# Patient Record
Sex: Male | Born: 1935 | Race: White | Hispanic: No | Marital: Married | Smoking: Never smoker
Health system: Southern US, Community
[De-identification: ages and names within clinical notes are randomized; demographics above are authoritative.]

## PROBLEM LIST (undated history)

## (undated) DIAGNOSIS — I10 Essential (primary) hypertension: Secondary | ICD-10-CM

## (undated) DIAGNOSIS — C801 Malignant (primary) neoplasm, unspecified: Secondary | ICD-10-CM

## (undated) DIAGNOSIS — IMO0002 Reserved for concepts with insufficient information to code with codable children: Secondary | ICD-10-CM

## (undated) DIAGNOSIS — C8510 Unspecified B-cell lymphoma, unspecified site: Secondary | ICD-10-CM

## (undated) DIAGNOSIS — C679 Malignant neoplasm of bladder, unspecified: Secondary | ICD-10-CM

## (undated) HISTORY — DX: Reserved for concepts with insufficient information to code with codable children: IMO0002

## (undated) HISTORY — DX: Malignant (primary) neoplasm, unspecified: C80.1

## (undated) HISTORY — DX: Malignant neoplasm of bladder, unspecified: C67.9

## (undated) HISTORY — DX: Essential (primary) hypertension: I10

## (undated) HISTORY — PX: CYSTOSCOPY: SUR368

## (undated) HISTORY — DX: Unspecified B-cell lymphoma, unspecified site: C85.10

---

## 2002-10-10 ENCOUNTER — Ambulatory Visit (HOSPITAL_COMMUNITY): Admission: RE | Admit: 2002-10-10 | Discharge: 2002-10-10 | Payer: Self-pay | Admitting: Specialist

## 2002-10-10 ENCOUNTER — Encounter: Payer: Self-pay | Admitting: Specialist

## 2004-07-22 ENCOUNTER — Inpatient Hospital Stay (HOSPITAL_COMMUNITY): Admission: AD | Admit: 2004-07-22 | Discharge: 2004-07-26 | Payer: Self-pay | Admitting: Neurosurgery

## 2004-07-25 ENCOUNTER — Encounter (INDEPENDENT_AMBULATORY_CARE_PROVIDER_SITE_OTHER): Payer: Self-pay | Admitting: *Deleted

## 2004-08-04 ENCOUNTER — Encounter (INDEPENDENT_AMBULATORY_CARE_PROVIDER_SITE_OTHER): Payer: Self-pay | Admitting: Specialist

## 2004-08-04 ENCOUNTER — Ambulatory Visit (HOSPITAL_COMMUNITY): Admission: RE | Admit: 2004-08-04 | Discharge: 2004-08-04 | Payer: Self-pay | Admitting: Oncology

## 2004-08-23 ENCOUNTER — Encounter (INDEPENDENT_AMBULATORY_CARE_PROVIDER_SITE_OTHER): Payer: Self-pay | Admitting: Specialist

## 2004-08-23 ENCOUNTER — Inpatient Hospital Stay (HOSPITAL_COMMUNITY): Admission: RE | Admit: 2004-08-23 | Discharge: 2004-08-25 | Payer: Self-pay | Admitting: Neurosurgery

## 2004-09-06 ENCOUNTER — Inpatient Hospital Stay (HOSPITAL_COMMUNITY): Admission: EM | Admit: 2004-09-06 | Discharge: 2004-09-07 | Payer: Self-pay | Admitting: *Deleted

## 2004-09-15 ENCOUNTER — Encounter: Admission: RE | Admit: 2004-09-15 | Discharge: 2004-09-15 | Payer: Self-pay | Admitting: Oncology

## 2004-10-07 ENCOUNTER — Ambulatory Visit (HOSPITAL_COMMUNITY): Admission: RE | Admit: 2004-10-07 | Discharge: 2004-10-07 | Payer: Self-pay | Admitting: Oncology

## 2004-10-11 ENCOUNTER — Ambulatory Visit: Payer: Self-pay | Admitting: Oncology

## 2004-11-17 ENCOUNTER — Ambulatory Visit (HOSPITAL_COMMUNITY): Admission: RE | Admit: 2004-11-17 | Discharge: 2004-11-17 | Payer: Self-pay | Admitting: Oncology

## 2005-02-02 ENCOUNTER — Ambulatory Visit: Payer: Self-pay | Admitting: Oncology

## 2005-04-03 ENCOUNTER — Ambulatory Visit: Payer: Self-pay | Admitting: Oncology

## 2005-04-06 ENCOUNTER — Ambulatory Visit (HOSPITAL_COMMUNITY): Admission: RE | Admit: 2005-04-06 | Discharge: 2005-04-06 | Payer: Self-pay | Admitting: Oncology

## 2005-06-16 ENCOUNTER — Ambulatory Visit: Payer: Self-pay | Admitting: Oncology

## 2005-08-03 ENCOUNTER — Ambulatory Visit (HOSPITAL_COMMUNITY): Admission: RE | Admit: 2005-08-03 | Discharge: 2005-08-03 | Payer: Self-pay | Admitting: Orthopedic Surgery

## 2005-08-03 ENCOUNTER — Ambulatory Visit (HOSPITAL_BASED_OUTPATIENT_CLINIC_OR_DEPARTMENT_OTHER): Admission: RE | Admit: 2005-08-03 | Discharge: 2005-08-03 | Payer: Self-pay | Admitting: Orthopedic Surgery

## 2005-08-11 ENCOUNTER — Ambulatory Visit: Payer: Self-pay | Admitting: Oncology

## 2005-10-13 ENCOUNTER — Ambulatory Visit: Payer: Self-pay | Admitting: Oncology

## 2005-12-12 ENCOUNTER — Ambulatory Visit: Payer: Self-pay | Admitting: Oncology

## 2006-02-12 ENCOUNTER — Ambulatory Visit: Payer: Self-pay | Admitting: Oncology

## 2006-04-11 ENCOUNTER — Ambulatory Visit: Payer: Self-pay | Admitting: Oncology

## 2006-04-13 LAB — CBC WITH DIFFERENTIAL/PLATELET
Basophils Absolute: 0 10*3/uL (ref 0.0–0.1)
EOS%: 1.6 % (ref 0.0–7.0)
HCT: 41.2 % (ref 38.7–49.9)
HGB: 14.3 g/dL (ref 13.0–17.1)
LYMPH%: 36.9 % (ref 14.0–48.0)
MCH: 31.5 pg (ref 28.0–33.4)
MCV: 91 fL (ref 81.6–98.0)
NEUT%: 52.2 % (ref 40.0–75.0)
Platelets: 246 10*3/uL (ref 145–400)
lymph#: 2.6 10*3/uL (ref 0.9–3.3)

## 2006-04-13 LAB — COMPREHENSIVE METABOLIC PANEL
ALT: 27 U/L (ref 0–40)
CO2: 28 mEq/L (ref 19–32)
Calcium: 9.1 mg/dL (ref 8.4–10.5)
Chloride: 102 mEq/L (ref 96–112)
Creatinine, Ser: 0.9 mg/dL (ref 0.4–1.5)
Total Protein: 6.9 g/dL (ref 6.0–8.3)

## 2006-04-13 LAB — LACTATE DEHYDROGENASE: LDH: 147 U/L (ref 94–250)

## 2006-04-13 LAB — MORPHOLOGY: PLT EST: ADEQUATE

## 2006-04-20 LAB — HEPATIC FUNCTION PANEL
AST: 23 U/L (ref 0–37)
Bilirubin, Direct: 0.1 mg/dL (ref 0.0–0.3)
Total Bilirubin: 0.4 mg/dL (ref 0.3–1.2)

## 2006-06-28 IMAGING — XA IR FLUORO GUIDE NDL PLMT / BX
1 series · 4 of 4 positions shown · non-contrast
Comparison: none

CLINICAL DATA: Multiple osseous lesions in the spine and pelvis. No known primary. 
 CORE BIOPSY OF LEFT ILIAC BONE UNDER FLUOROSCOPY
TECHNIQUE: Based on the previous MRI from 07/23/2004 an appropriate approach was selected.  Overlying skin was prepped with Betadine and draped in the usual sterile fashion and infiltrated locally with 1% lidocaine.  Intravenous Fentanyl and Versed were administered as conscious sedation during continuous cardiorespiratory monitoring by radiology RN.  An 18 gauge Carrillo trocar needle was advanced across the cortex of the left iliac bone using a posterior approach at the level of the lesion identified on MR. Once the needle was secured in the cortex, multiple 13 gauge core biopsies were obtained using a coaxial bone biopsy needle.  The guiding needle was then removed.  No immediate complication.  
 IMPRESSION
 Technically successful core biopsy of left iliac bone lesion under fluoroscopic guidance.

[Series 1: run · 4 of 4 slices shown]
[im 1/4]
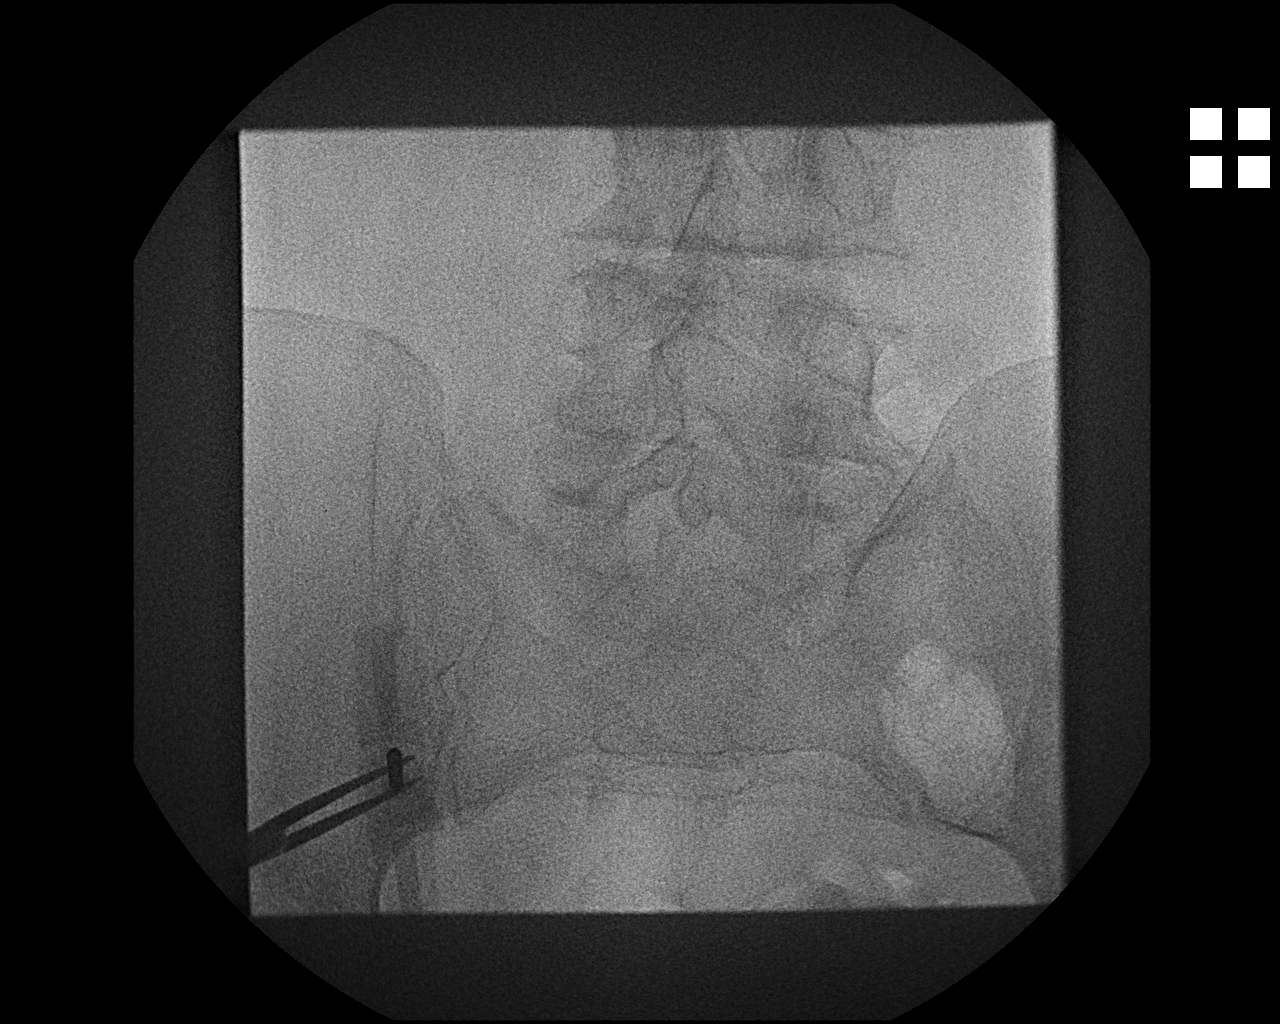
[im 2/4]
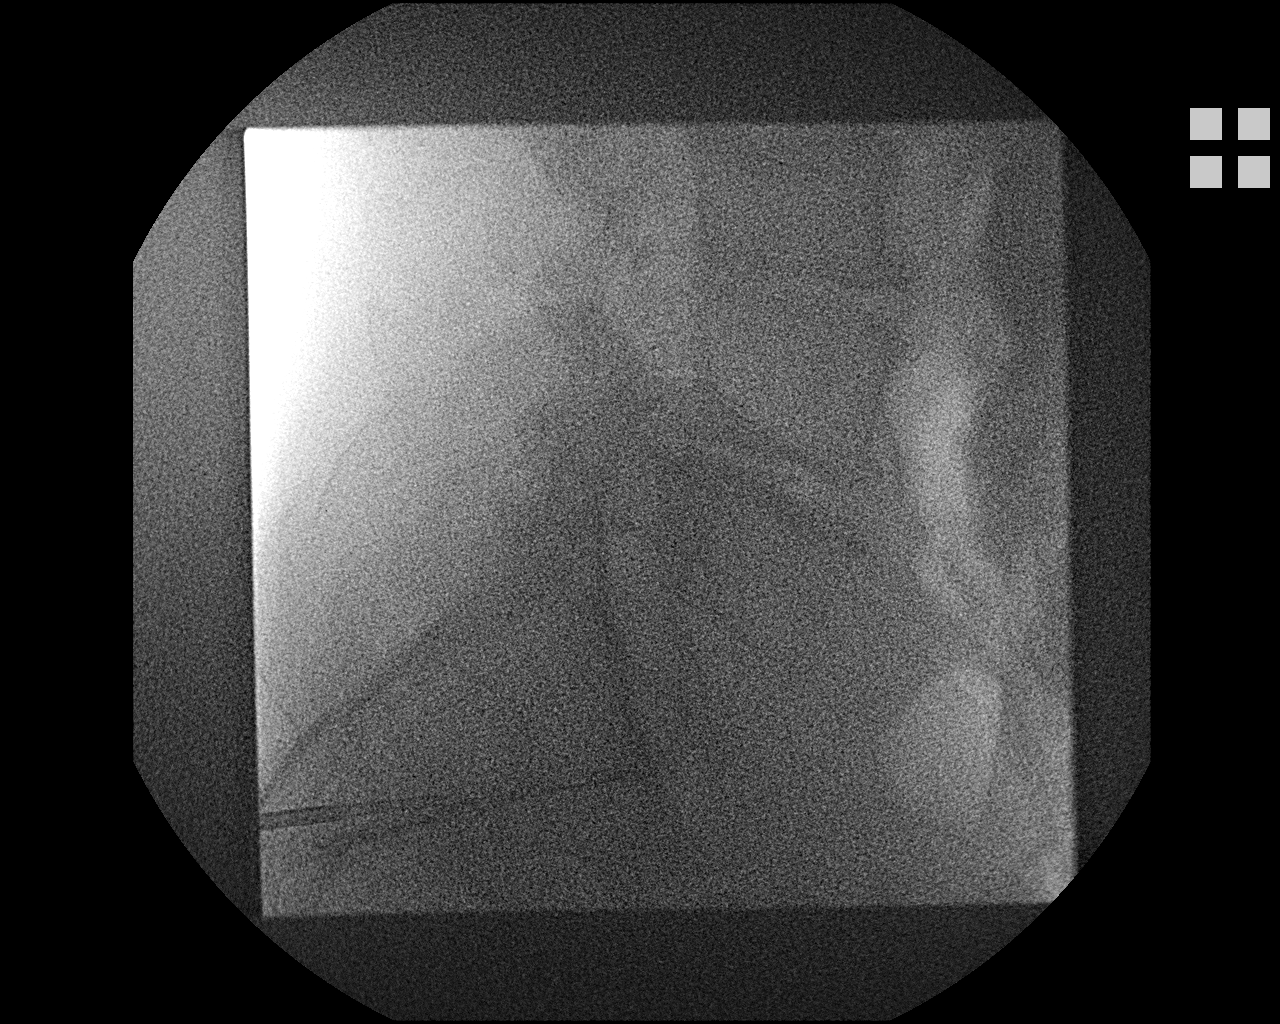
[im 3/4]
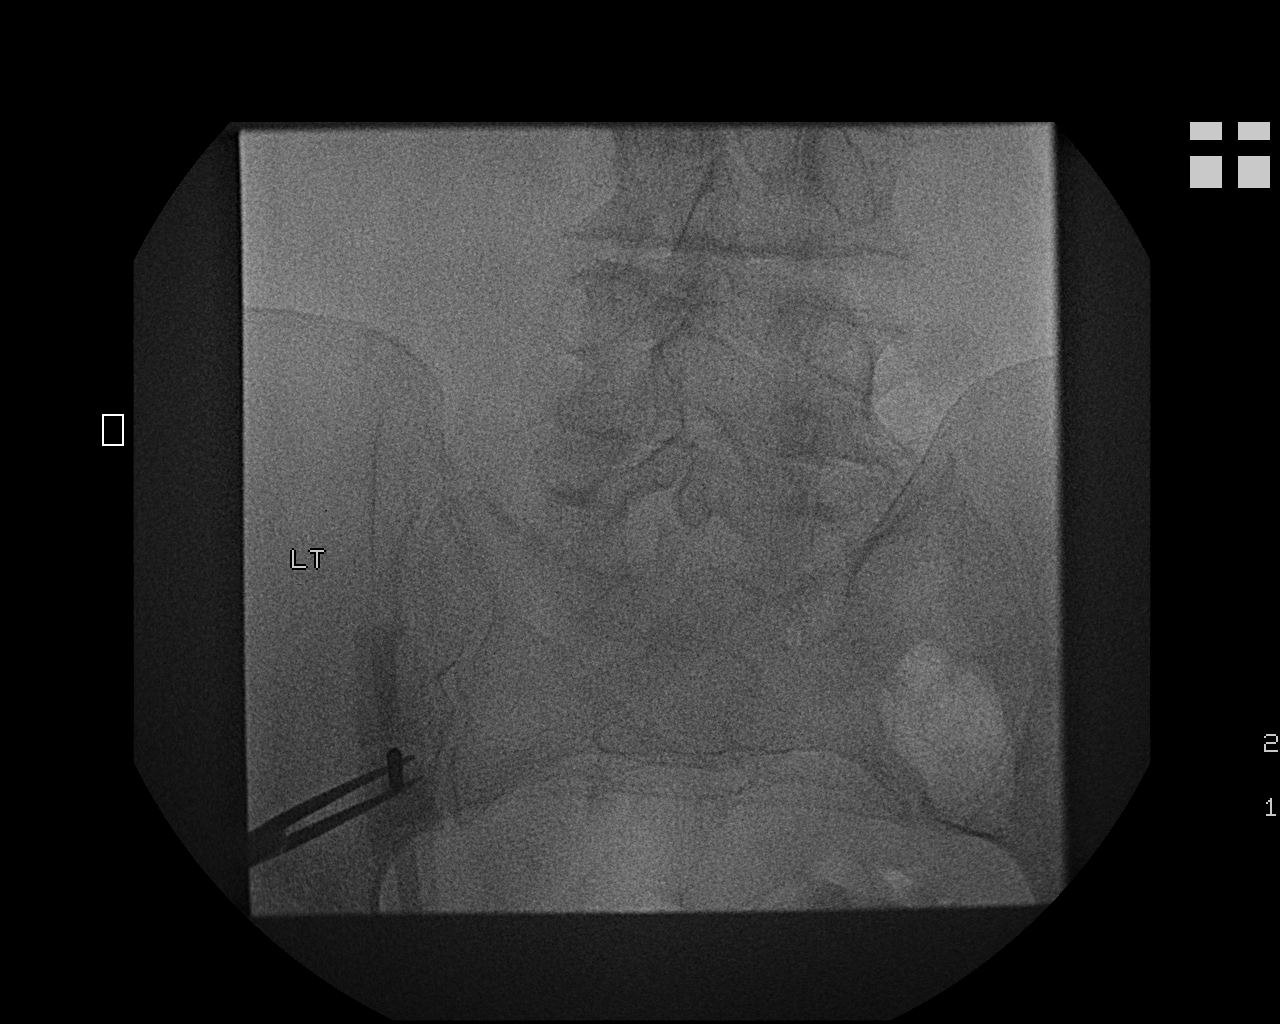
[im 4/4]
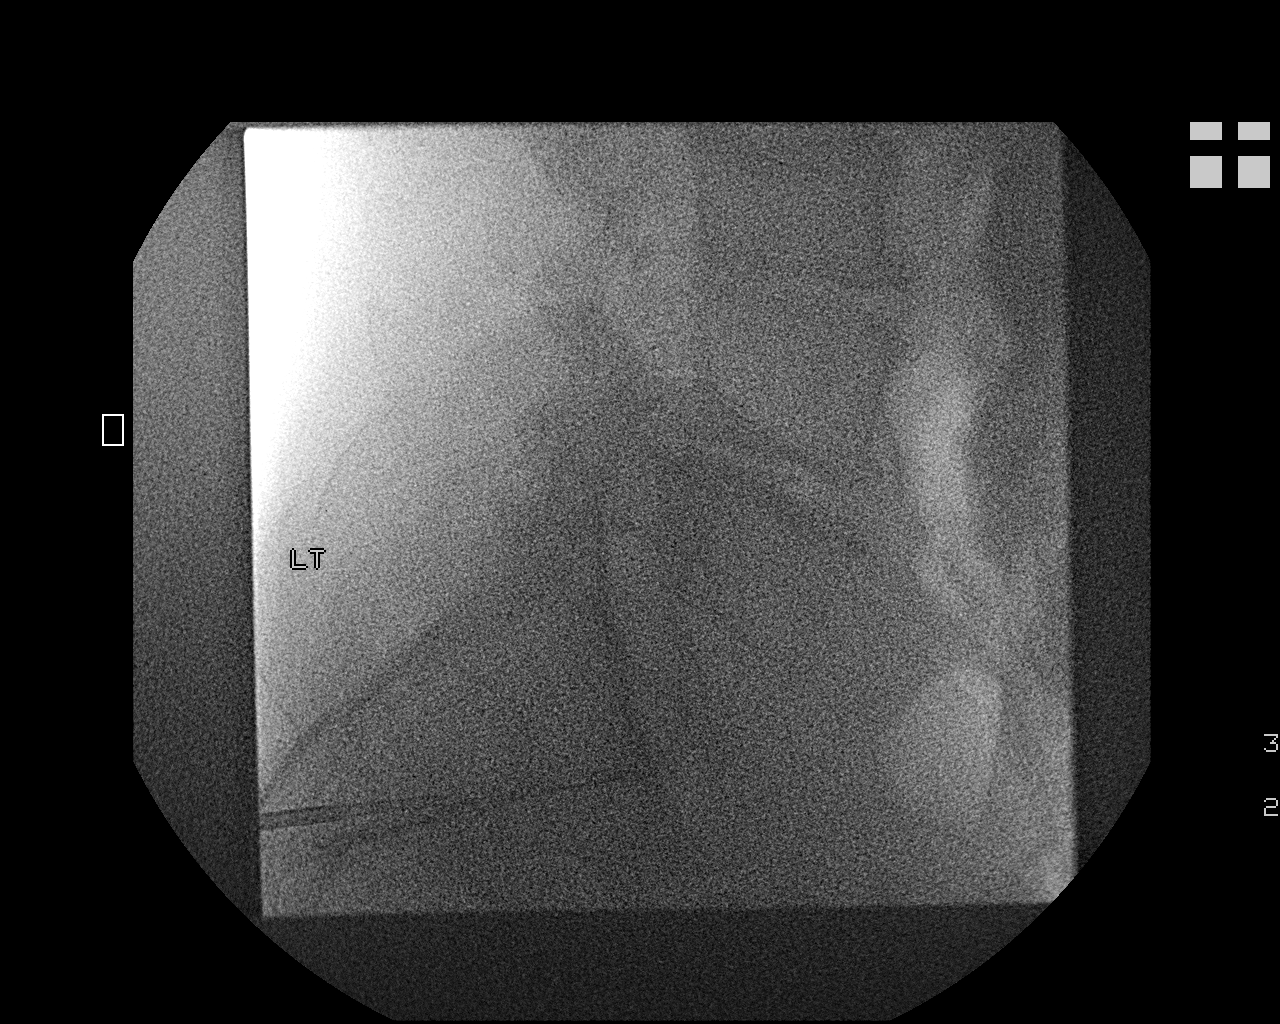

[4 of 4 positions shown; findings below may reference images not displayed]

## 2006-06-28 IMAGING — NM NM BONE WHOLE BODY
2 series · 2 of 2 positions shown · non-contrast
Comparison: Chest, abdomen and pelvis CT dated 07/23/04, cervical spine MR dated 07/23/04, lumbar spine MRI dated 07/22/04, cervical and thoracic spine MR dated 07/23/04.

CLINICAL DATA: Multiple spinal masses at recent spinal MR.  
WHOLE BODY NUCLEAR BONE SCAN ? 07/25/04

[Series 1: tb total body · 0.62mm/px · 1 of 1 slices shown (1 of 2)]
[im 1/1]
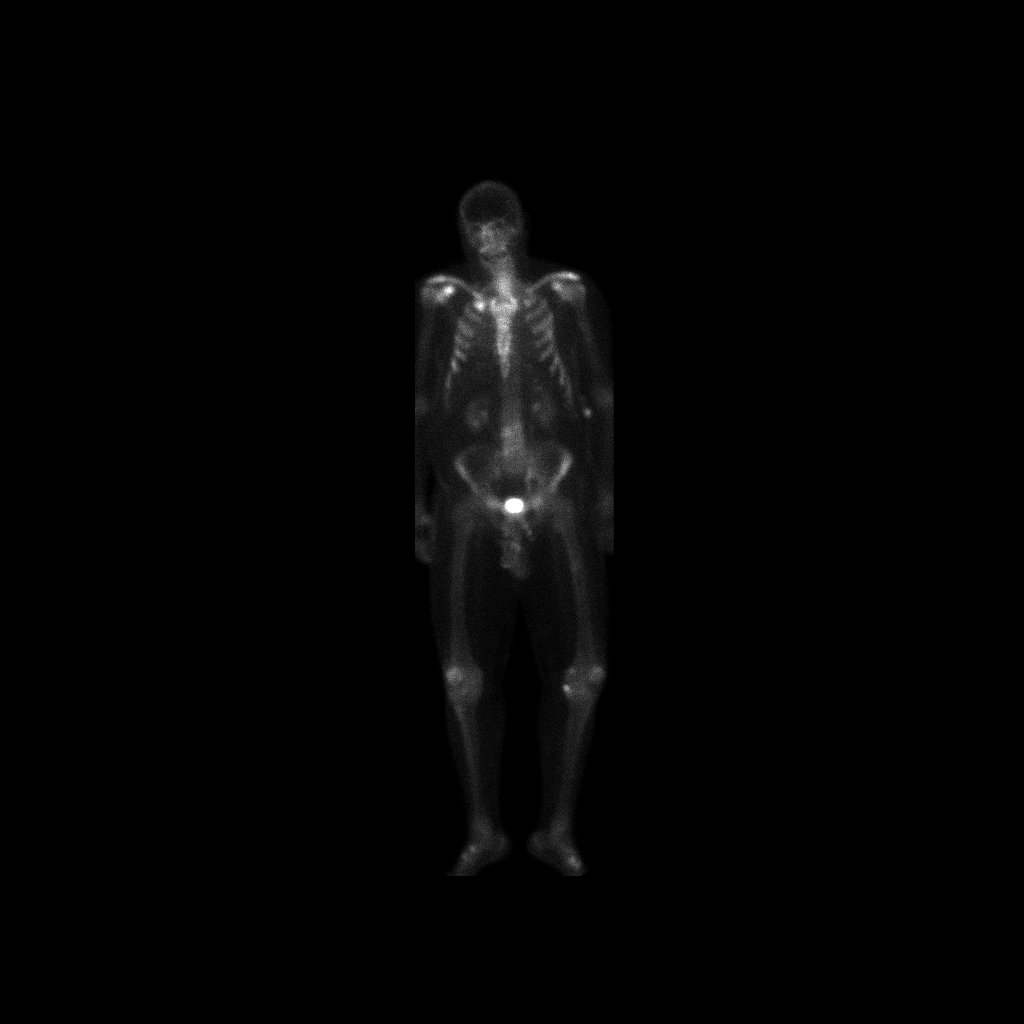

[Series 1: tb total body · 0.62mm/px · 1 of 1 slices shown (2 of 2)]
[im 1/1]
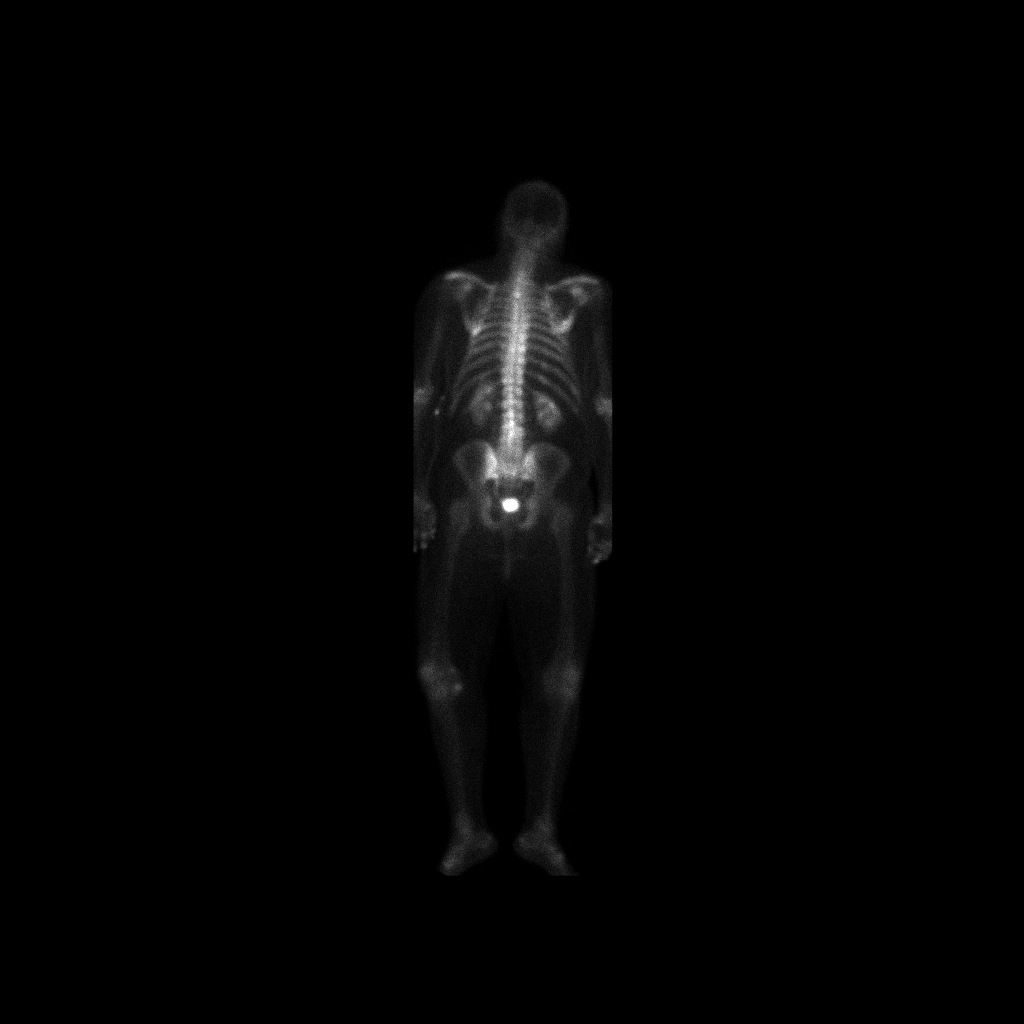

[2 of 2 positions shown; findings below may reference images not displayed]

Following the intravenous administration of 25 mCi of Technetium 99m MDP, delayed static images of the bony skeleton were obtained.   These demonstrate mildly increased tracer uptake in the medial aspects of the left knee, compatible with degenerative changes. Also demonstrated is increased tracer activity in the right mid foot and left mid to distal foot, compatible with degenerative changes.  The tracer distribution throughout the remainder of the bony skeleton is within normal limits, including the spine.   Normal renal and bladder activity.  
IMPRESSION 
Degenerative changes in the left knee and both feet, as described above. 
No abnormal tracer activity in the remainder of the bony skeleton, including the spine.  This makes multiple myeloma the most likely explanation for the multiple masses seen in the spine on the recent MR examinations.  Purely lytic metastatic disease is less likely.

## 2006-08-23 ENCOUNTER — Ambulatory Visit: Payer: Self-pay | Admitting: Oncology

## 2006-08-28 LAB — CBC WITH DIFFERENTIAL/PLATELET
BASO%: 0.5 % (ref 0.0–2.0)
EOS%: 1.1 % (ref 0.0–7.0)
MCH: 32.3 pg (ref 28.0–33.4)
MCHC: 34.9 g/dL (ref 32.0–35.9)
MONO#: 0.7 10*3/uL (ref 0.1–0.9)
NEUT%: 51.4 % (ref 40.0–75.0)
RBC: 4.43 10*6/uL (ref 4.20–5.71)
RDW: 13.6 % (ref 11.2–14.6)
WBC: 6.9 10*3/uL (ref 4.0–10.0)
lymph#: 2.5 10*3/uL (ref 0.9–3.3)

## 2006-08-28 LAB — COMPREHENSIVE METABOLIC PANEL
ALT: 30 U/L (ref 0–40)
Alkaline Phosphatase: 87 U/L (ref 39–117)
CO2: 25 mEq/L (ref 19–32)
Creatinine, Ser: 0.93 mg/dL (ref 0.40–1.50)
Sodium: 139 mEq/L (ref 135–145)
Total Bilirubin: 0.4 mg/dL (ref 0.3–1.2)

## 2006-08-28 LAB — MORPHOLOGY
PLT EST: ADEQUATE
RBC Comments: NORMAL

## 2006-09-10 IMAGING — CR DG CHEST 2V
2 series · 2 of 2 positions shown · non-contrast
Comparison: none

CLINICAL DATA: History of lymphoma.  Follow-up interstitial pneumonia. 
 PA AND LATERAL CHEST, 10/07/04
 Cardiac size is normal.  There is mild infiltrate within the left lung base but it is difficult to state whether there has been any change compared to the CT scan of 09/15/04.  Small pulmonary nodules on the CT are not readily apparent on these radiographs.  No pleural fluid.  Surgical hardware within the lower cervical spine.
 IMPRESSION
 Mild left basal infiltrate that could be scar or inflammatory.

[view not recorded (1 of 2)]
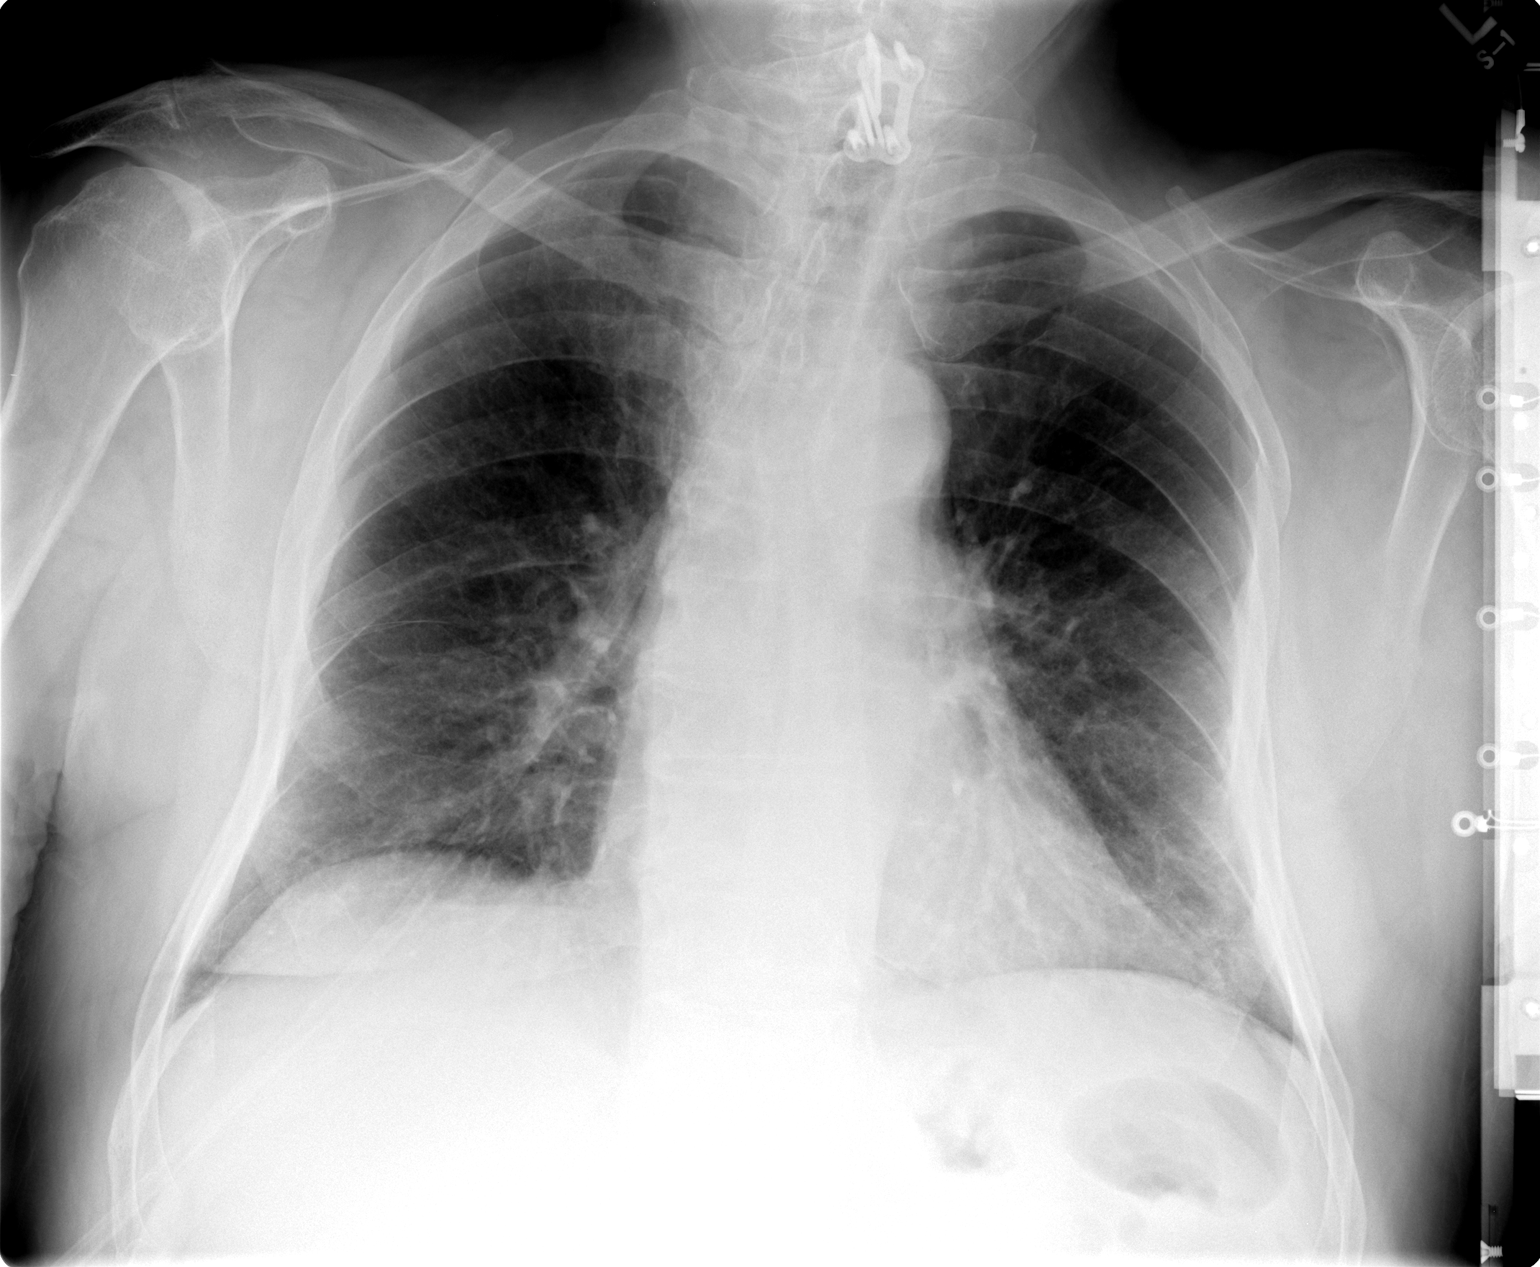

[view not recorded (2 of 2)]
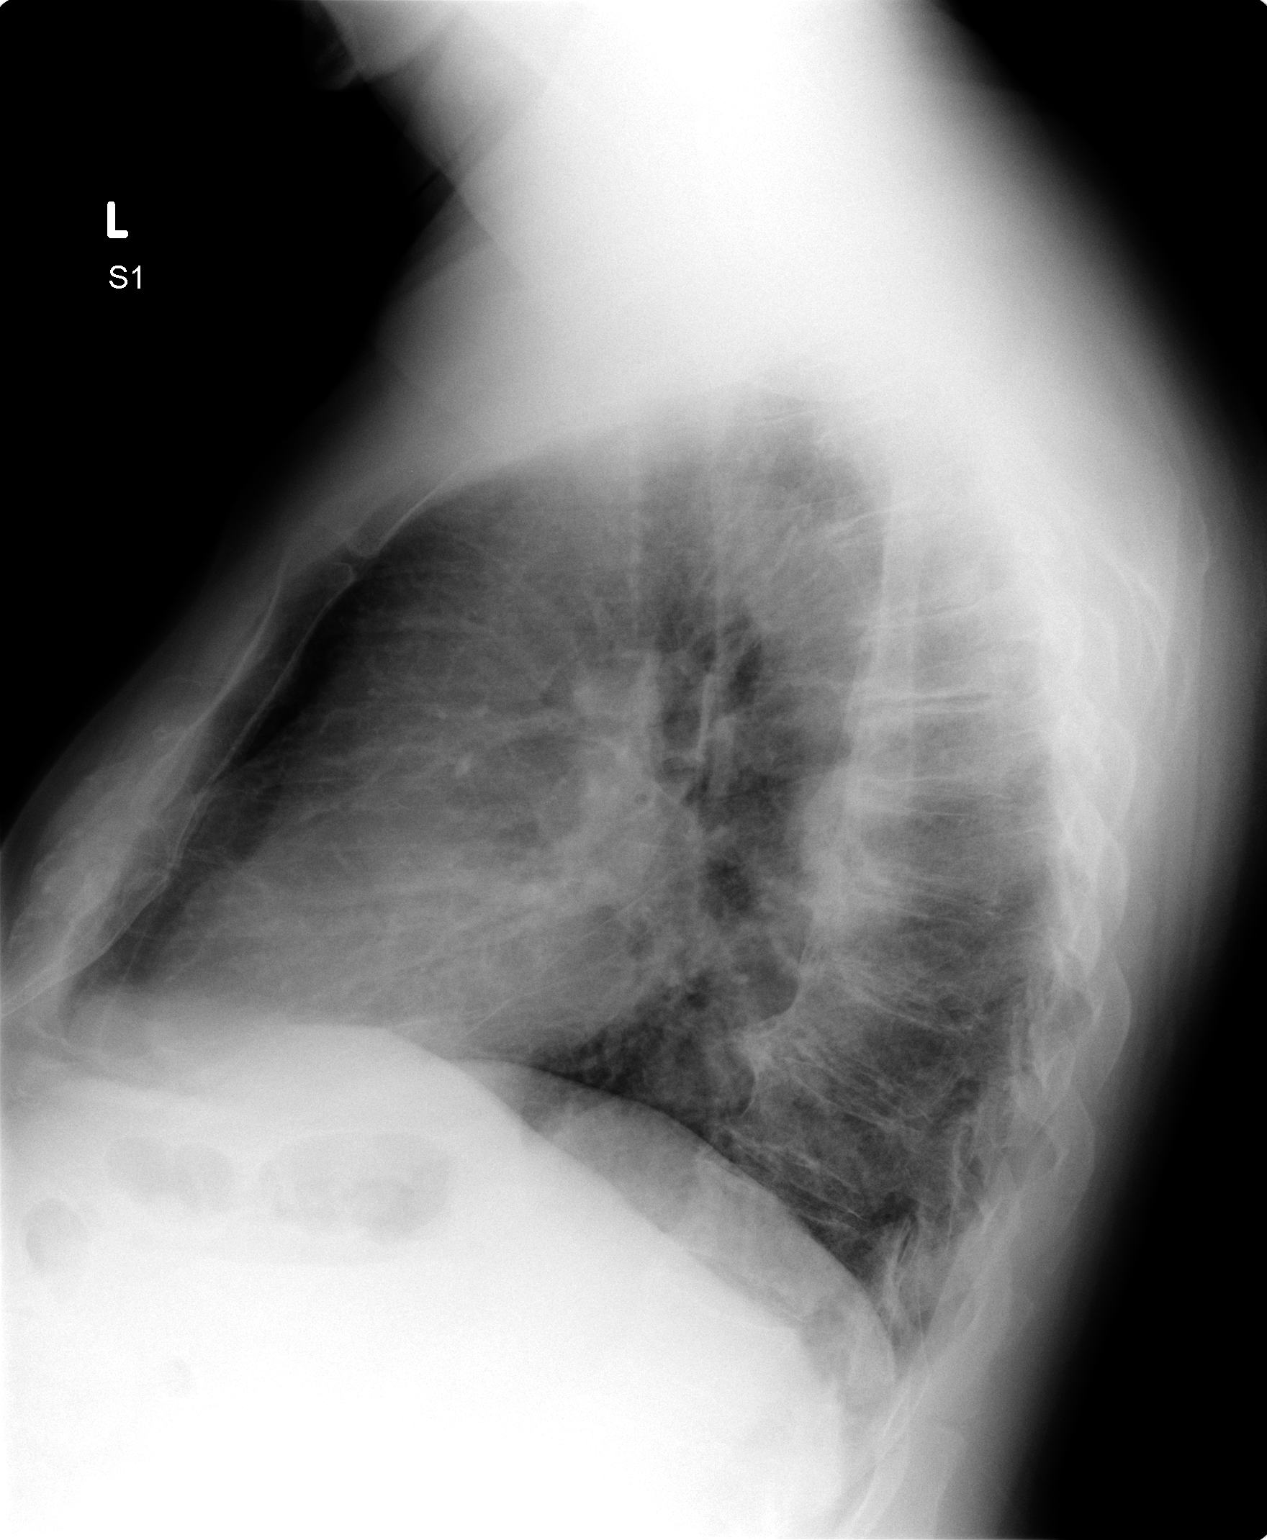

[2 of 2 positions shown; findings below may reference images not displayed]

## 2006-12-18 ENCOUNTER — Ambulatory Visit: Payer: Self-pay | Admitting: Oncology

## 2006-12-21 LAB — CBC WITH DIFFERENTIAL/PLATELET
Basophils Absolute: 0 10*3/uL (ref 0.0–0.1)
Eosinophils Absolute: 0.1 10*3/uL (ref 0.0–0.5)
HCT: 41 % (ref 38.7–49.9)
LYMPH%: 32.6 % (ref 14.0–48.0)
MONO#: 0.6 10*3/uL (ref 0.1–0.9)
NEUT#: 4.3 10*3/uL (ref 1.5–6.5)
NEUT%: 57.1 % (ref 40.0–75.0)
Platelets: 324 10*3/uL (ref 145–400)
WBC: 7.5 10*3/uL (ref 4.0–10.0)

## 2006-12-21 LAB — COMPREHENSIVE METABOLIC PANEL
Albumin: 4.3 g/dL (ref 3.5–5.2)
CO2: 26 mEq/L (ref 19–32)
Glucose, Bld: 109 mg/dL — ABNORMAL HIGH (ref 70–99)
Potassium: 4.3 mEq/L (ref 3.5–5.3)
Sodium: 140 mEq/L (ref 135–145)
Total Protein: 7 g/dL (ref 6.0–8.3)

## 2006-12-21 LAB — LACTATE DEHYDROGENASE: LDH: 157 U/L (ref 94–250)

## 2007-04-25 ENCOUNTER — Ambulatory Visit: Payer: Self-pay | Admitting: Oncology

## 2007-05-22 ENCOUNTER — Ambulatory Visit: Payer: Self-pay | Admitting: Oncology

## 2007-05-22 LAB — CBC WITH DIFFERENTIAL/PLATELET
BASO%: 0.3 % (ref 0.0–2.0)
EOS%: 1.7 % (ref 0.0–7.0)
LYMPH%: 30.2 % (ref 14.0–48.0)
MCH: 32.8 pg (ref 28.0–33.4)
MCHC: 35.4 g/dL (ref 32.0–35.9)
MONO#: 0.7 10*3/uL (ref 0.1–0.9)
Platelets: 241 10*3/uL (ref 145–400)
RBC: 4.25 10*6/uL (ref 4.20–5.71)
WBC: 7.1 10*3/uL (ref 4.0–10.0)

## 2007-05-22 LAB — COMPREHENSIVE METABOLIC PANEL
ALT: 35 U/L (ref 0–53)
AST: 20 U/L (ref 0–37)
Alkaline Phosphatase: 87 U/L (ref 39–117)
CO2: 26 mEq/L (ref 19–32)
Creatinine, Ser: 0.91 mg/dL (ref 0.40–1.50)
Sodium: 139 mEq/L (ref 135–145)
Total Bilirubin: 0.4 mg/dL (ref 0.3–1.2)
Total Protein: 6.9 g/dL (ref 6.0–8.3)

## 2007-05-22 LAB — ERYTHROCYTE SEDIMENTATION RATE: Sed Rate: 6 mm/hr (ref 0–20)

## 2007-05-22 LAB — MORPHOLOGY
PLT EST: ADEQUATE
RBC Comments: NORMAL

## 2007-05-22 LAB — LACTATE DEHYDROGENASE: LDH: 161 U/L (ref 94–250)

## 2007-09-03 ENCOUNTER — Ambulatory Visit: Payer: Self-pay | Admitting: Oncology

## 2007-09-05 LAB — COMPREHENSIVE METABOLIC PANEL
BUN: 18 mg/dL (ref 6–23)
CO2: 27 mEq/L (ref 19–32)
Creatinine, Ser: 0.89 mg/dL (ref 0.40–1.50)
Glucose, Bld: 111 mg/dL — ABNORMAL HIGH (ref 70–99)
Total Bilirubin: 0.4 mg/dL (ref 0.3–1.2)
Total Protein: 7.2 g/dL (ref 6.0–8.3)

## 2007-09-05 LAB — CBC WITH DIFFERENTIAL/PLATELET
BASO%: 0.6 % (ref 0.0–2.0)
Basophils Absolute: 0 10*3/uL (ref 0.0–0.1)
EOS%: 1.1 % (ref 0.0–7.0)
HCT: 39.1 % (ref 38.7–49.9)
HGB: 14 g/dL (ref 13.0–17.1)
LYMPH%: 29.1 % (ref 14.0–48.0)
MCH: 33.8 pg — ABNORMAL HIGH (ref 28.0–33.4)
MCHC: 35.9 g/dL (ref 32.0–35.9)
MCV: 94.2 fL (ref 81.6–98.0)
MONO%: 7.5 % (ref 0.0–13.0)
NEUT%: 61.7 % (ref 40.0–75.0)
Platelets: 253 10*3/uL (ref 145–400)

## 2007-09-05 LAB — MORPHOLOGY

## 2007-09-05 LAB — LACTATE DEHYDROGENASE: LDH: 160 U/L (ref 94–250)

## 2008-03-13 ENCOUNTER — Ambulatory Visit: Payer: Self-pay | Admitting: Oncology

## 2008-03-17 LAB — MORPHOLOGY: PLT EST: ADEQUATE

## 2008-03-17 LAB — COMPREHENSIVE METABOLIC PANEL
ALT: 20 U/L (ref 0–53)
Albumin: 4.4 g/dL (ref 3.5–5.2)
CO2: 27 mEq/L (ref 19–32)
Calcium: 9.2 mg/dL (ref 8.4–10.5)
Chloride: 102 mEq/L (ref 96–112)
Glucose, Bld: 99 mg/dL (ref 70–99)
Sodium: 137 mEq/L (ref 135–145)
Total Bilirubin: 0.4 mg/dL (ref 0.3–1.2)
Total Protein: 7 g/dL (ref 6.0–8.3)

## 2008-03-17 LAB — ERYTHROCYTE SEDIMENTATION RATE: Sed Rate: 12 mm/hr (ref 0–20)

## 2008-03-17 LAB — CBC WITH DIFFERENTIAL/PLATELET
Basophils Absolute: 0 10*3/uL (ref 0.0–0.1)
Eosinophils Absolute: 0.1 10*3/uL (ref 0.0–0.5)
HCT: 39.4 % (ref 38.7–49.9)
HGB: 13.9 g/dL (ref 13.0–17.1)
MCV: 91.9 fL (ref 81.6–98.0)
NEUT#: 4.1 10*3/uL (ref 1.5–6.5)
NEUT%: 58.4 % (ref 40.0–75.0)
RDW: 13.4 % (ref 11.2–14.6)
lymph#: 2.2 10*3/uL (ref 0.9–3.3)

## 2008-03-17 LAB — LACTATE DEHYDROGENASE: LDH: 125 U/L (ref 94–250)

## 2008-09-17 ENCOUNTER — Ambulatory Visit: Payer: Self-pay | Admitting: Oncology

## 2008-09-21 LAB — CBC WITH DIFFERENTIAL/PLATELET
BASO%: 0.4 % (ref 0.0–2.0)
EOS%: 1.4 % (ref 0.0–7.0)
Eosinophils Absolute: 0.1 10*3/uL (ref 0.0–0.5)
MCHC: 35.1 g/dL (ref 32.0–35.9)
MCV: 93.8 fL (ref 81.6–98.0)
MONO%: 6.2 % (ref 0.0–13.0)
NEUT#: 4.4 10*3/uL (ref 1.5–6.5)
RBC: 4.48 10*6/uL (ref 4.20–5.71)
RDW: 13.4 % (ref 11.2–14.6)

## 2008-09-21 LAB — ERYTHROCYTE SEDIMENTATION RATE: Sed Rate: 11 mm/hr (ref 0–20)

## 2008-09-21 LAB — MORPHOLOGY

## 2008-09-21 LAB — COMPREHENSIVE METABOLIC PANEL
ALT: 29 U/L (ref 0–53)
AST: 22 U/L (ref 0–37)
BUN: 25 mg/dL — ABNORMAL HIGH (ref 6–23)
Calcium: 9.4 mg/dL (ref 8.4–10.5)
Chloride: 102 mEq/L (ref 96–112)
Creatinine, Ser: 1.04 mg/dL (ref 0.40–1.50)
Total Bilirubin: 0.4 mg/dL (ref 0.3–1.2)

## 2008-09-21 LAB — LACTATE DEHYDROGENASE: LDH: 147 U/L (ref 94–250)

## 2009-03-26 ENCOUNTER — Ambulatory Visit: Payer: Self-pay | Admitting: Oncology

## 2009-03-30 LAB — COMPREHENSIVE METABOLIC PANEL
AST: 22 U/L (ref 0–37)
Albumin: 4 g/dL (ref 3.5–5.2)
BUN: 18 mg/dL (ref 6–23)
Calcium: 9.1 mg/dL (ref 8.4–10.5)
Chloride: 104 mEq/L (ref 96–112)
Glucose, Bld: 105 mg/dL — ABNORMAL HIGH (ref 70–99)
Potassium: 4.2 mEq/L (ref 3.5–5.3)
Total Protein: 7 g/dL (ref 6.0–8.3)

## 2009-03-30 LAB — MORPHOLOGY: PLT EST: ADEQUATE

## 2009-03-30 LAB — CBC WITH DIFFERENTIAL/PLATELET
BASO%: 0.3 % (ref 0.0–2.0)
Basophils Absolute: 0 10*3/uL (ref 0.0–0.1)
Eosinophils Absolute: 0.1 10*3/uL (ref 0.0–0.5)
HCT: 39.7 % (ref 38.4–49.9)
HGB: 13.8 g/dL (ref 13.0–17.1)
LYMPH%: 39.2 % (ref 14.0–49.0)
MCH: 33.5 pg — ABNORMAL HIGH (ref 27.2–33.4)
MCV: 96.2 fL (ref 79.3–98.0)
NEUT#: 3.3 10*3/uL (ref 1.5–6.5)
NEUT%: 49.7 % (ref 39.0–75.0)
RDW: 12.6 % (ref 11.0–14.6)
lymph#: 2.6 10*3/uL (ref 0.9–3.3)

## 2009-09-24 ENCOUNTER — Ambulatory Visit: Payer: Self-pay | Admitting: Oncology

## 2009-09-28 LAB — MORPHOLOGY
PLT EST: ADEQUATE
RBC Comments: NORMAL

## 2009-09-28 LAB — CBC WITH DIFFERENTIAL/PLATELET
BASO%: 0.4 % (ref 0.0–2.0)
EOS%: 1.1 % (ref 0.0–7.0)
MCH: 33.4 pg (ref 27.2–33.4)
MCHC: 34.9 g/dL (ref 32.0–36.0)
RBC: 4.11 10*6/uL — ABNORMAL LOW (ref 4.20–5.82)
RDW: 13.4 % (ref 11.0–14.6)
lymph#: 1.8 10*3/uL (ref 0.9–3.3)

## 2009-09-28 LAB — COMPREHENSIVE METABOLIC PANEL
ALT: 15 U/L (ref 0–53)
AST: 16 U/L (ref 0–37)
Alkaline Phosphatase: 93 U/L (ref 39–117)
BUN: 17 mg/dL (ref 6–23)
Calcium: 8.7 mg/dL (ref 8.4–10.5)
Creatinine, Ser: 1 mg/dL (ref 0.40–1.50)
Total Bilirubin: 0.3 mg/dL (ref 0.3–1.2)

## 2009-09-28 LAB — ERYTHROCYTE SEDIMENTATION RATE: Sed Rate: 10 mm/hr (ref 0–20)

## 2010-03-25 ENCOUNTER — Ambulatory Visit: Payer: Self-pay | Admitting: Oncology

## 2010-03-29 LAB — CBC WITH DIFFERENTIAL/PLATELET
Basophils Absolute: 0.1 10*3/uL (ref 0.0–0.1)
EOS%: 1.9 % (ref 0.0–7.0)
HGB: 14 g/dL (ref 13.0–17.1)
LYMPH%: 30 % (ref 14.0–49.0)
MCH: 34.1 pg — ABNORMAL HIGH (ref 27.2–33.4)
MCV: 96.4 fL (ref 79.3–98.0)
MONO%: 9.8 % (ref 0.0–14.0)
Platelets: 212 10*3/uL (ref 140–400)
RBC: 4.1 10*6/uL — ABNORMAL LOW (ref 4.20–5.82)
RDW: 13.1 % (ref 11.0–14.6)

## 2010-03-29 LAB — MORPHOLOGY

## 2010-03-30 LAB — COMPREHENSIVE METABOLIC PANEL
Albumin: 4.3 g/dL (ref 3.5–5.2)
Alkaline Phosphatase: 97 U/L (ref 39–117)
BUN: 20 mg/dL (ref 6–23)
CO2: 24 mEq/L (ref 19–32)
Calcium: 8.8 mg/dL (ref 8.4–10.5)
Chloride: 101 mEq/L (ref 96–112)
Glucose, Bld: 104 mg/dL — ABNORMAL HIGH (ref 70–99)
Potassium: 4.1 mEq/L (ref 3.5–5.3)
Sodium: 138 mEq/L (ref 135–145)
Total Protein: 7 g/dL (ref 6.0–8.3)

## 2010-03-30 LAB — SEDIMENTATION RATE: Sed Rate: 7 mm/hr (ref 0–16)

## 2010-03-30 LAB — LACTATE DEHYDROGENASE: LDH: 140 U/L (ref 94–250)

## 2010-09-23 ENCOUNTER — Ambulatory Visit: Payer: Self-pay | Admitting: Oncology

## 2010-10-03 LAB — CBC WITH DIFFERENTIAL/PLATELET
BASO%: 0.3 % (ref 0.0–2.0)
Basophils Absolute: 0 10*3/uL (ref 0.0–0.1)
EOS%: 1.7 % (ref 0.0–7.0)
Eosinophils Absolute: 0.1 10*3/uL (ref 0.0–0.5)
HCT: 41 % (ref 38.4–49.9)
HGB: 14 g/dL (ref 13.0–17.1)
LYMPH%: 29.7 % (ref 14.0–49.0)
MCH: 32.9 pg (ref 27.2–33.4)
MCHC: 34.2 g/dL (ref 32.0–36.0)
MCV: 96.3 fL (ref 79.3–98.0)
MONO#: 0.4 10*3/uL (ref 0.1–0.9)
MONO%: 7.2 % (ref 0.0–14.0)
NEUT#: 3.5 10*3/uL (ref 1.5–6.5)
NEUT%: 61.1 % (ref 39.0–75.0)
Platelets: 197 10*3/uL (ref 140–400)
RBC: 4.26 10*6/uL (ref 4.20–5.82)
RDW: 13.2 % (ref 11.0–14.6)
WBC: 5.7 10*3/uL (ref 4.0–10.3)
lymph#: 1.7 10*3/uL (ref 0.9–3.3)

## 2010-10-03 LAB — MORPHOLOGY
PLT EST: ADEQUATE
RBC Comments: NORMAL

## 2010-10-04 LAB — COMPREHENSIVE METABOLIC PANEL
ALT: 18 U/L (ref 0–53)
AST: 19 U/L (ref 0–37)
CO2: 25 mEq/L (ref 19–32)
Calcium: 9.2 mg/dL (ref 8.4–10.5)
Chloride: 101 mEq/L (ref 96–112)
Sodium: 140 mEq/L (ref 135–145)
Total Protein: 7 g/dL (ref 6.0–8.3)

## 2010-10-04 LAB — LACTATE DEHYDROGENASE: LDH: 173 U/L (ref 94–250)

## 2011-04-04 ENCOUNTER — Other Ambulatory Visit: Payer: Self-pay | Admitting: Oncology

## 2011-04-04 ENCOUNTER — Encounter (HOSPITAL_BASED_OUTPATIENT_CLINIC_OR_DEPARTMENT_OTHER): Payer: BC Managed Care – PPO | Admitting: Oncology

## 2011-04-04 DIAGNOSIS — M25529 Pain in unspecified elbow: Secondary | ICD-10-CM

## 2011-04-04 DIAGNOSIS — C8581 Other specified types of non-Hodgkin lymphoma, lymph nodes of head, face, and neck: Secondary | ICD-10-CM

## 2011-04-04 DIAGNOSIS — C8589 Other specified types of non-Hodgkin lymphoma, extranodal and solid organ sites: Secondary | ICD-10-CM

## 2011-04-04 LAB — CBC WITH DIFFERENTIAL/PLATELET
BASO%: 0.3 % (ref 0.0–2.0)
EOS%: 1.6 % (ref 0.0–7.0)
MCH: 32.9 pg (ref 27.2–33.4)
MCHC: 34.5 g/dL (ref 32.0–36.0)
MONO#: 0.6 10*3/uL (ref 0.1–0.9)
NEUT%: 61.2 % (ref 39.0–75.0)
RBC: 4.38 10*6/uL (ref 4.20–5.82)
RDW: 13.8 % (ref 11.0–14.6)
WBC: 6.3 10*3/uL (ref 4.0–10.3)
lymph#: 1.7 10*3/uL (ref 0.9–3.3)

## 2011-04-04 LAB — LACTATE DEHYDROGENASE: LDH: 149 U/L (ref 94–250)

## 2011-04-04 LAB — COMPREHENSIVE METABOLIC PANEL
Albumin: 4.4 g/dL (ref 3.5–5.2)
CO2: 27 mEq/L (ref 19–32)
Calcium: 9.6 mg/dL (ref 8.4–10.5)
Glucose, Bld: 116 mg/dL — ABNORMAL HIGH (ref 70–99)
Potassium: 4.4 mEq/L (ref 3.5–5.3)
Sodium: 141 mEq/L (ref 135–145)
Total Bilirubin: 0.2 mg/dL — ABNORMAL LOW (ref 0.3–1.2)
Total Protein: 7 g/dL (ref 6.0–8.3)

## 2011-04-04 LAB — MORPHOLOGY: PLT EST: ADEQUATE

## 2011-04-04 LAB — SEDIMENTATION RATE: Sed Rate: 5 mm/hr (ref 0–16)

## 2011-04-28 NOTE — Op Note (Signed)
NAMEEURAL, HOLZSCHUH                ACCOUNT NO.:  [id number]   MEDICAL RECORD NO.:  [id number]          PATIENT TYPE:  AMB   LOCATION:  DSC                          FACILITY:  [facility]   PHYSICIAN:  Cindee Salt, M.D.       DATE OF BIRTH:  [birth year]   DATE OF PROCEDURE:  08/03/2005  DATE OF DISCHARGE:                                 OPERATIVE REPORT   PREOPERATIVE DIAGNOSIS:  Foreign body, left hand.   POSTOPERATIVE DIAGNOSIS:  Foreign body, left hand.   OPERATION:  Excision removal foreign body, left first web space.   SURGEON:  Kuzma.   ASSISTANT:  Carolyne Fiscal R.N.   ANESTHESIA:  General.   HISTORY:  The patient is a 77 year old male who suffered a piece of wood to  his left first web space.  He has a palpable mass still present. He is  desirous of removal.   PROCEDURE:  The patient is brought to the operating room and general  anesthetic carried out without difficulty.  He was prepped using DuraPrep,  supine position, left arm free. The mass was palpable over the dorsal aspect  incision was made. Purulent material was immediately encountered. This was  through skin only. Cultures were taken for both aerobic and anaerobic  cultures. A large piece of wood was then easily able to be extruded.  A  separate incision was made over the entrance wound through skin only,  deepened with a hemostat. A tract was then identified. A vessel loop drain  placed through dorsal to palmar.  A sterile compressive dressing applied.  The patient tolerated procedure well and was taken to the recovery room for  observation in satisfactory condition. He is discharged home to return to  the [facility] on Monday on Vicodin and Keflex.           ______________________________  Cindee Salt, M.D.     GK/MEDQ  D:  08/03/2005  T:  08/03/2005  Job:  [id number]

## 2011-04-28 NOTE — H&P (Signed)
NAMETAYARI, YANKEE                ACCOUNT NO.:  [id number]   MEDICAL RECORD NO.:  [id number]          PATIENT TYPE:  INP   LOCATION:  1825                         FACILITY:  [facility]   PHYSICIAN:  Mobolaji B. Bakare, M.D.DATE OF BIRTH:  [birth year]   DATE OF ADMISSION:  09/05/2004  DATE OF DISCHARGE:                                HISTORY & PHYSICAL   Primary care physician:  Dr. Sammuel Cooper at [facility].  Neurosurgeon:  Payton Doughty, M.D.  Oncologist:  Genene Churn.  Granfortuna, M.D.   CHIEF COMPLAINT:  Generalized weakness, lack of concentration, aphasia,  dysphagia within the last 24 hours.   HISTORY OF PRESENTING COMPLAINT:  Mr. Amison is a 77 year old right-handed  white male with history of hypertension and hyperlipidemia, who was recently  diagnosed with lymphoma and yet to commence treatment.  He had corpectomy  and biopsy of C6 in September about two weeks ago.  The patient was  discharged home on steroid, currently he is using 4 mg p.o. q.6h.  Since he  was discharged to home, wife states that the patient has been having lack of  concentration, sometimes misses his way to work and gets confused.  He is  sometimes forgetful.  However, he remained stable until yesterday afternoon  when wife returned from work and found patient was lying still on the floor.  He could not get up from the floor.  He said he could not talk and lay  still.  He could not move his extremities; however, he was able to wiggle  his toes and his hand grip was poor.  At some point he had dysphagia.  Hence, the patient had CT scan of the head in the emergency department,  which was negative.   Presently the patient could move his extremities and all symptoms have  resolved.  He does not feel dysphagic or dysarthric or dysphasic at this  point.   REVIEW OF SYSTEMS:  No shortness of breath, no chest pain.  No fever.  He  does have yellow postnasal drip and he also complained of  uncomfortable  multiple lesions on his genitalia.  No associated palpitations, PND,  orthopnea.  No edema.  No dysuria, no urgency, no hematuria.  No headaches  and no change in his vision.  He does have cough secondary to the postnasal  drip.   PAST MEDICAL HISTORY:  1.  Hypertension.  2.  Reflux disease.  3.  Hyperlipidemia.  4.  Renal stones.  5.  Impotence.  6.  Nerve damage to some nerves in his left hand during a chiropractor      treatment.   PAST SURGICAL HISTORY:  1.  Bilateral cataract surgery.  2.  Surgery to right rotator cuff, right shoulder.  3.  Penile implant.  4.  Corpectomy and biopsy in September 2005.   CURRENT MEDICATIONS:  1.  Cozaar 100 mg p.o. daily.  2.  Aciphex 20 mg p.o. daily.  3.  Dexamethasone 4 mg p.o. q.6h.   ALLERGIES:  No known drug allergies.   SOCIAL HISTORY:  He does  not smoke cigarettes, occasionally drinks alcohol.  Occupation:  He is a Scientist, water quality.   FAMILY HISTORY:  Father died of prostate cancer in his 75s, and mother died  in her 72s.  He is married and lives with his wife.  He is pretty much  independent of activities of daily living.  He still drives around.   PHYSICAL EXAMINATION:  VITAL SIGNS:  Initial vitals in the emergency  department:  Temperature of 98, blood pressure of 189/95, pulse rate of 106,  respiratory rate of 22, pulse oximetry 96% on room air.  GENERAL:  On examination, the patient is alert and oriented in time, place,  and person.  HEENT:  Normocephalic, atraumatic head.  Mucous membranes are without  thrush.  NECK:  No carotid bruit, no cervical lymphadenopathy.  LUNGS:  Vesicular breath sounds, no crackles, no wheeze.  CARDIOVASCULAR:  S1, S2, regular, no gallop, no rub.  ABDOMEN:  Obese, soft, nontender, no hepatosplenomegaly.  EXTREMITIES:  No pedal edema, no calf tenderness, no cyanosis.  GENITALIA:  Multiple lesions on the mons pubis at different stages of  healing, looks like herpes.  CENTRAL  NERVOUS SYSTEM:  Cranial nerves intact.  Motor power 4/5 bilaterally  in all limbs.  Muscle reflexes are 1+ in knees and trace in ankles, 1+ in  biceps and triceps, bilaterally equal.  Muscle tone equal bilaterally.  Plantar reflex is plantar bilaterally.   INITIAL LABORATORY DATA:  White cell count of 9.6, hematocrit of 39.3, MCV  of 90.3, platelet count of 196, neutrophils 96%, lymphocytes of 2%.  Sodium  130, potassium 3.6, chloride 95, bicarb 27, glucose of 64, BUN of 24,  creatinine of 1.7, calcium of 8.1, total protein of 5.0, albumin 2.9, AST  21, ALT 69, alkaline phosphatase 54, bilirubin of 0.6.  UA negative for  leukocytes and nitrite, negative for protein.  EKG:  Sinus tachycardia of  102, right bundle branch block, left axis deviation.  Essentially EKG is  unchanged from previous EKG of August 2005.   ASSESSMENT AND PLAN:  Mr. Niswander is a 77 year old white male with a history  of hypertension, hyperlipidemia, was diagnosed with lymphoma involving the  vertebrae __________ in September 2005.  He had corpectomy of C6 and biopsy  and is now presenting with lack of concentration, getting lost, and  inability to move his extremities, dysphasia, dysphagia.  Symptoms are now  resolved completely.   1.  Most likely side effect of steroids, steroid psychosis, versus transient      ischemic attack, versus central nervous system lymphoma.  Will admit to      telemetry, obtain MRI of the brain, start aspirin 325 mg p.o. daily.      Neurologic checks q.4h.  PT/OT evaluation.  Swallowing evaluation.  Will      reduce dose of Decadron to 2 mg q.6h. and consult with Dr. Channing Mutters and Dr.      Cyndie Chime regarding tapering the Decadron.  Check CPK.  Will use      insulin-sensitive sliding scale to cover a.c. and q.h.s.  2.  Oral thrush.  Will give Diflucan 200 mg p.o. x1, then 100 mg p.o. daily.  3.  Postnasal drip and nasal congestion.  Will use normal saline nasal spray     and Flonase one spray  daily both nostrils.  We will empirically give      antibiotics, Augmentin 500 mg b.i.d., in view of high-dose steroid that      patient is on.  4.  Multiple ulcerations on genitalia, most likely herpes genitalis.      Empirical treatment with acyclovir cream, applied five times a day.  5.  Hypertension, uncontrolled.  Continue Cozaar 100 mg p.o. daily.  6.  Reflux disease.  Protonix 40 mg p.o. daily.  7.  Hyperlipidemia.  The patient is not on medication at this point.  8.  Vertebral lymphoma.  Will consult Dr. Cyndie Chime to follow.  9.  Elevated AST and hyponatremia.  Will monitor and repeat CMP in a.m.  10. Deep vein thrombosis prophylaxis.  Will use TED stockings.       MBB/MEDQ  D:  09/06/2004  T:  09/06/2004  Job:  [id number]   cc:   Sammuel Cooper, M.D.  [facility]

## 2011-04-28 NOTE — Consult Note (Signed)
Eric Sexton, VINK NO.:  [id number]   MEDICAL RECORD NO.:  [id number]                   PATIENT TYPE:  INP   LOCATION:  3030                                 FACILITY:  [facility]   PHYSICIAN:  Genene Churn. Cyndie Chime, M.D.          DATE OF BIRTH:  03-25-1931   DATE OF CONSULTATION:  07/24/2004  DATE OF DISCHARGE:                                   CONSULTATION   REASON FOR CONSULTATION:  This is a hematology/oncology consultation to  evaluate this man for newly discovered extensive lesions of his thoracic  lumbar and sacral spine.   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  He began to have pain in his left leg about one  month ago.  This has been progressive and associated with intermittent  weakness.  He had temporary relief by a brief trial of steroids, but  symptoms promptly recurred.  He was referred for further evaluation.  A MRI  of the spine done on August 12 showed extensive destructive lesions of the  lower thoracic spine beginning at T11 and T12, lumbar spine with near total  replacement of the L3 body with tumor which extends outside bone into the  anterior and anterolateral epidural space causing compression of the thecal  sac with involvement of the lateral recesses.  There is L3 pedicle  involvement with slight epidural extension of tumor, encroachment on L3  nerve roots.  There is severe involvement of the sacrum, particularly S1,  with extraosseous extension into the anterior epidural space and  encroachment on the distal thecal sac.  In addition, there was extensive  involvement of the cervical spine from C2 through C6 with additional disease  at T2.  There is early cord compression at C5-6 due to an anterior disk  herniation.   Of note, a CT scan of the chest, abdomen, and pelvis failed to show any  primary tumor in the chest, abdominal or pelvic cavities.   Laboratory evaluation  to date also unremarkable except for a mild  normochromic anemia with hemoglobin of 13, MCV 91.  A serum total protein is  normal at 6.0 with albumin of 3.5, calcium 8.5, alkaline phosphatase 75.  Per Dr. Temple Pacini verbal communication to me, a PSA checked as an outpatient  recently was normal.   PAST MEDICAL HISTORY:  1. Hypertension.  2. Reflux.  3. Renal stones.  4. Impotence.  5. Nerve damage to some of the nerves on his left hand.  6. Previous right rotator cuff surgery.  7. Penile implant.  8. Bilateral cataract surgery.   No cardiopulmonary disease.  No GI disease.  No thyroid disease.  No history  of bleeding or clotting.   MEDICATIONS ON ADMISSION:  1. Cozaar 100 mg daily.  2. Aciphex 20 mg daily.   ALLERGIES:  None.   FAMILY HISTORY:  Father died of  prostate cancer.  Mother died in her 73s of  old age.  He has one brother who has been treated for prostate cancer,  another brother who is alive and well.   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  Never smoked.  Rare alcoholic beverage.  One pint lasts one year or two  years.   REVIEW OF SYSTEMS:  He has not had any loss of appetite, no weight loss, no  cardiorespiratory complaints, no GI complaints, no GU complaints.  He denied  any paresthesias, no fecal or urinary incontinence.  He has had some back  pain, but he said the leg pain is really much worse than the back.   PHYSICAL EXAMINATION:  GENERAL: Well-nourished man looking younger than his  age.  VITAL SIGNS:  Blood pressure 143/74, pulse 82 and regular, respirations 20,  temperature 98.5.  SKIN, HAIR, AND NAILS:  Normal.  HEENT:  Pupils reactive to light.  Bilateral intraocular lens implants.  Pharynx: No erythema, exudate, or mass.  NECK:  Supple.  No thyromegaly or thyroid mass.  No lymphadenopathy.  CARDIAC: Regular cardiac rhythm, no murmur.  LUNGS:  Clear.  ABDOMEN:  Soft, nontender.  No mass, no organomegaly.   EXTREMITIES:  No edema, no calf tenderness.  NEUROLOGIC:  On steroids.  Mental status intact.  Cranial nerves intact.  Motor strength 5/5.  Reflexes 1+ symmetric at the biceps, 2+ at the right  knee, absent to trace at the left knee.  Sensation not tested.   IMPRESSION:  Extensive destructive lesions of the cervical, thoracic,  lumbar, and sacral spine with unknown primary cancer.   The differential diagnosis certainly includes multiple myeloma, although  typical bone marrow changes were not commented on by the radiologist when  reading the MRI studies.  Metastatic solid tumor such as adenocarcinoma is  still in the differential.   RECOMMENDATIONS:  1. I agree with a biopsy of a spine lesion to establish a tissue diagnosis.  2. I agree with checking serum and urin protein and immunoelectrophoresis     studies.  3. He may need a procedure to stabilize his spine.  I will leave this to the     discretion of Dr. Channing Mutters.   Thank you for this consultation.  Further recommendations to follow when  diagnosis established.                                               Genene Churn. Cyndie Chime, M.D.    Lottie Rater  D:  07/24/2004  T:  07/24/2004  Job:  [id number]   cc:   Corinna L. Lendell Caprice, MD   Payton Doughty, M.D.  [address]  [place] [address]  Fax: [phone]   Vernona Rieger, M.D.  [employer].

## 2011-04-28 NOTE — Discharge Summary (Signed)
NAMESHIRLEY, BOLLE NO.:  [id number]   MEDICAL RECORD NO.:  [id number]          PATIENT TYPE:  INP   LOCATION:  3030                         FACILITY:  [facility]   PHYSICIAN:  Payton Doughty, M.D.      DATE OF BIRTH:  1931-10-31   DATE OF ADMISSION:  07/22/2004  DATE OF DISCHARGE:  07/26/2004                                 DISCHARGE SUMMARY   ADMITTING DIAGNOSES:  Spine tumor.   DISCHARGE DIAGNOSES:  Spine tumor.   PROCEDURE:  Percutaneous biopsy by the radiologist.   A 77 year old right-handed white gentleman whose history and physical is  recounted in the chart.  He had been transferred from another hospital with  back and left leg pain and paraspinous pain that was relieved with a Medrol  dose pack.  MRI showed tumor in the lumbar spine and thoracic spine,  cervical spine and he was transferred to [facility].  His examination was intact.  He had a nontender spine and MR as noted above.  He was visited by the  radiation oncology service and they had a bone marrow biopsy and biopsy of  the iliac crest infiltrates by the radiology service.  These were not very  productive but further biopsies were planned.  August 16 he was Decadron and  Dilantin.  Strength was full and he was discharged home in the care of his  family and to follow up with the oncology service.       MWR/MEDQ  D:  11/15/2004  T:  11/15/2004  Job:  [id number]

## 2011-10-04 ENCOUNTER — Encounter (HOSPITAL_BASED_OUTPATIENT_CLINIC_OR_DEPARTMENT_OTHER): Payer: BC Managed Care – PPO | Admitting: Oncology

## 2011-10-04 ENCOUNTER — Other Ambulatory Visit: Payer: Self-pay | Admitting: Oncology

## 2011-10-04 DIAGNOSIS — C8581 Other specified types of non-Hodgkin lymphoma, lymph nodes of head, face, and neck: Secondary | ICD-10-CM

## 2011-10-04 DIAGNOSIS — M25529 Pain in unspecified elbow: Secondary | ICD-10-CM

## 2011-10-04 DIAGNOSIS — C8589 Other specified types of non-Hodgkin lymphoma, extranodal and solid organ sites: Secondary | ICD-10-CM

## 2011-10-04 LAB — COMPREHENSIVE METABOLIC PANEL
ALT: 22 U/L (ref 0–53)
Albumin: 4.2 g/dL (ref 3.5–5.2)
CO2: 26 mEq/L (ref 19–32)
Calcium: 8.8 mg/dL (ref 8.4–10.5)
Chloride: 99 mEq/L (ref 96–112)
Creatinine, Ser: 1.05 mg/dL (ref 0.50–1.35)
Sodium: 136 mEq/L (ref 135–145)
Total Protein: 6.7 g/dL (ref 6.0–8.3)

## 2011-10-04 LAB — CBC WITH DIFFERENTIAL/PLATELET
BASO%: 0.4 % (ref 0.0–2.0)
HCT: 40.7 % (ref 38.4–49.9)
HGB: 14.2 g/dL (ref 13.0–17.1)
MCHC: 34.8 g/dL (ref 32.0–36.0)
MONO#: 0.4 10*3/uL (ref 0.1–0.9)
NEUT%: 61.5 % (ref 39.0–75.0)
WBC: 5.4 10*3/uL (ref 4.0–10.3)
lymph#: 1.6 10*3/uL (ref 0.9–3.3)

## 2012-01-18 ENCOUNTER — Telehealth: Payer: Self-pay | Admitting: Oncology

## 2012-01-18 NOTE — Telephone Encounter (Signed)
Gave appt to wife Cordelia Pen on 04/09/12 lab and MD

## 2012-04-02 ENCOUNTER — Ambulatory Visit: Payer: BC Managed Care – PPO | Admitting: Oncology

## 2012-04-09 ENCOUNTER — Telehealth: Payer: Self-pay | Admitting: Oncology

## 2012-04-09 ENCOUNTER — Ambulatory Visit (HOSPITAL_BASED_OUTPATIENT_CLINIC_OR_DEPARTMENT_OTHER): Payer: BC Managed Care – PPO | Admitting: Oncology

## 2012-04-09 ENCOUNTER — Other Ambulatory Visit (HOSPITAL_BASED_OUTPATIENT_CLINIC_OR_DEPARTMENT_OTHER): Payer: BC Managed Care – PPO | Admitting: Lab

## 2012-04-09 ENCOUNTER — Encounter: Payer: Self-pay | Admitting: Oncology

## 2012-04-09 VITALS — BP 137/73 | HR 79 | Temp 97.0°F | Ht 69.0 in | Wt 251.6 lb

## 2012-04-09 DIAGNOSIS — M25529 Pain in unspecified elbow: Secondary | ICD-10-CM

## 2012-04-09 DIAGNOSIS — C8510 Unspecified B-cell lymphoma, unspecified site: Secondary | ICD-10-CM

## 2012-04-09 DIAGNOSIS — C8589 Other specified types of non-Hodgkin lymphoma, extranodal and solid organ sites: Secondary | ICD-10-CM

## 2012-04-09 DIAGNOSIS — C8581 Other specified types of non-Hodgkin lymphoma, lymph nodes of head, face, and neck: Secondary | ICD-10-CM

## 2012-04-09 DIAGNOSIS — B029 Zoster without complications: Secondary | ICD-10-CM | POA: Insufficient documentation

## 2012-04-09 DIAGNOSIS — IMO0002 Reserved for concepts with insufficient information to code with codable children: Secondary | ICD-10-CM

## 2012-04-09 HISTORY — DX: Unspecified B-cell lymphoma, unspecified site: C85.10

## 2012-04-09 HISTORY — DX: Reserved for concepts with insufficient information to code with codable children: IMO0002

## 2012-04-09 LAB — CBC WITH DIFFERENTIAL/PLATELET
Basophils Absolute: 0 10*3/uL (ref 0.0–0.1)
Eosinophils Absolute: 0.1 10*3/uL (ref 0.0–0.5)
HCT: 39.7 % (ref 38.4–49.9)
MCH: 33.8 pg — ABNORMAL HIGH (ref 27.2–33.4)
MONO#: 0.5 10*3/uL (ref 0.1–0.9)
MONO%: 8.2 % (ref 0.0–14.0)
RDW: 13.4 % (ref 11.0–14.6)
WBC: 5.5 10*3/uL (ref 4.0–10.3)

## 2012-04-09 LAB — COMPREHENSIVE METABOLIC PANEL
Alkaline Phosphatase: 84 U/L (ref 39–117)
Glucose, Bld: 177 mg/dL — ABNORMAL HIGH (ref 70–99)
Sodium: 137 mEq/L (ref 135–145)
Total Bilirubin: 0.3 mg/dL (ref 0.3–1.2)
Total Protein: 6.9 g/dL (ref 6.0–8.3)

## 2012-04-09 LAB — LACTATE DEHYDROGENASE: LDH: 139 U/L (ref 94–250)

## 2012-04-09 NOTE — Telephone Encounter (Signed)
gv pt appt schedule for oct and referral for cxr to be done 10/22 same day as lb.

## 2012-04-11 NOTE — Progress Notes (Signed)
Hematology and Oncology Follow Up Visit  Eric Sexton [id number] [birth year] 78 y.o. 04/11/2012 1:50 PM   Principle Diagnosis: Encounter Diagnoses  Name Primary?  . Low grade B-cell lymphoma Yes  . Steroid-induced psychosis   . Herpes zoster      Interim History:   Followup visit for this 78 year old man diagnosed with Low grade B cell non-Hodgkin's lymphoma presenting with severe neck and back pain in August, 2005.  Diagnosis only established after a laminectomy was done on his neck.  He was initially treated with high dose steroids for almost two months before establishing a diagnosis.  He then received single agent Rituxan weekly x four in November 2005. He then got consolidation with Rituxan at four month intervals for four additional courses through March of 2007. He developed steroid related psychosis and interstitial pneumonia with initial treatment. He achieved a complete and durable remission. He has been followed with serial CT scans and MRI scans which have shown no evidence for recurrence through most recent studies done in October of 2010. I stopped getting routine scans at that time.  He is doing well. He has had no interim problems. stopped getting routine scans at that point. He denies any new areas of pain. Appetite is good.   Medications: reviewed  Allergies:  Allergies  Allergen Reactions  . Prednisone Other (See Comments)    Steroid psychosis  . Sulfur Other (See Comments)    confusion    Review of Systems: Constitutional:   No constitutional symptoms Respiratory: No cough or dyspnea Cardiovascular:  No chest pain or palpitations Gastrointestinal: No abdominal pain or change in bowel but Genito-Urinary: No urinary tract symptoms Musculoskeletal: No pain except due to arthritis Neurologic: No headache or change in vision Skin: No rash or ecchymosis Remaining ROS negative.  Physical Exam: Blood pressure 137/73, pulse 79, temperature 97 F (36.1 C),  temperature source Oral, height 5\' 9"  (1.753 m), weight 251 lb 9.6 oz (114.125 kg). Wt Readings from Last 3 Encounters:  04/09/12 251 lb 9.6 oz (114.125 kg)     General appearance: Well-nourished Caucasian man HENNT: Pharynx no erythema or exudate Lymph nodes: No cervical supraclavicular or axillary adenopathy Breasts: Lungs: Clear to auscultation resonant to percussion Heart: Regular rhythm no murmur Abdomen: Soft nontender no mass no organomegaly Extremities: No edema no calf tenderness Vascular: No cyanosis Neurologic: Motor strength 5 over 5 reflexes 1+ symmetric Skin: No rash or ecchymosis  Lab Results: Lab Results  Component Value Date   WBC 5.5 04/09/2012   HGB 13.6 04/09/2012   HCT 39.7 04/09/2012   MCV 98.5* 04/09/2012   PLT 214 04/09/2012     Chemistry      Component Value Date/Time   NA 137 04/09/2012 1047   K 4.4 04/09/2012 1047   CL 98 04/09/2012 1047   CO2 29 04/09/2012 1047   BUN 20 04/09/2012 1047   CREATININE 0.99 04/09/2012 1047      Component Value Date/Time   CALCIUM 9.3 04/09/2012 1047   ALKPHOS 84 04/09/2012 1047   AST 18 04/09/2012 1047   ALT 19 04/09/2012 1047   BILITOT 0.3 04/09/2012 1047       Impression and Plan:  #1 low-grade B-cell non-Hodgkin's lymphoma He remains in remission now out almost 8 years from diagnosis in August of 2005. I will continue to monitor his status on an every 6 month basis.  #2. History of steroid psychosis with initial treatment  #3. History of interstitial pneumonia possibly pneumocystis at time  of initial diagnosis when on high-dose steroids  #4. 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    CC:. Dr. Jerelyn Charles: [facility]; Dr. Alvino Chapel in [place]; Dr. Josephine Igo  Levert Feinstein, MD 5/2/20131:50 PM

## 2012-10-01 ENCOUNTER — Other Ambulatory Visit (HOSPITAL_BASED_OUTPATIENT_CLINIC_OR_DEPARTMENT_OTHER): Payer: BC Managed Care – PPO | Admitting: Lab

## 2012-10-01 ENCOUNTER — Ambulatory Visit (HOSPITAL_COMMUNITY)
Admission: RE | Admit: 2012-10-01 | Discharge: 2012-10-01 | Disposition: A | Discharge status: Home / Self Care | Payer: BC Managed Care – PPO | Source: Ambulatory Visit | Attending: Oncology | Admitting: Oncology

## 2012-10-01 DIAGNOSIS — C8589 Other specified types of non-Hodgkin lymphoma, extranodal and solid organ sites: Secondary | ICD-10-CM | POA: Insufficient documentation

## 2012-10-01 DIAGNOSIS — C8510 Unspecified B-cell lymphoma, unspecified site: Secondary | ICD-10-CM

## 2012-10-01 LAB — CBC WITH DIFFERENTIAL/PLATELET
BASO%: 0.4 % (ref 0.0–2.0)
EOS%: 1.6 % (ref 0.0–7.0)
MCH: 33.4 pg (ref 27.2–33.4)
MCHC: 35.1 g/dL (ref 32.0–36.0)
MONO#: 0.7 10*3/uL (ref 0.1–0.9)
NEUT#: 4.5 10*3/uL (ref 1.5–6.5)
RDW: 13.3 % (ref 11.0–14.6)
lymph#: 2.2 10*3/uL (ref 0.9–3.3)

## 2012-10-01 LAB — COMPREHENSIVE METABOLIC PANEL (CC13)
ALT: 24 U/L (ref 0–55)
AST: 17 U/L (ref 5–34)
Albumin: 4.1 g/dL (ref 3.5–5.0)
Alkaline Phosphatase: 99 U/L (ref 40–150)
Glucose: 114 mg/dl — ABNORMAL HIGH (ref 70–99)
Potassium: 4 mEq/L (ref 3.5–5.1)
Sodium: 137 mEq/L (ref 136–145)
Total Bilirubin: 0.3 mg/dL (ref 0.20–1.20)
Total Protein: 7.1 g/dL (ref 6.4–8.3)

## 2012-10-01 LAB — SEDIMENTATION RATE: Sed Rate: 8 mm/hr (ref 0–16)

## 2012-10-08 ENCOUNTER — Telehealth: Payer: Self-pay | Admitting: Oncology

## 2012-10-08 ENCOUNTER — Ambulatory Visit: Payer: BC Managed Care – PPO | Admitting: Lab

## 2012-10-08 ENCOUNTER — Ambulatory Visit (HOSPITAL_BASED_OUTPATIENT_CLINIC_OR_DEPARTMENT_OTHER): Payer: BC Managed Care – PPO | Admitting: Nurse Practitioner

## 2012-10-08 VITALS — BP 146/73 | HR 87 | Temp 97.8°F | Resp 20 | Ht 69.0 in | Wt 243.7 lb

## 2012-10-08 DIAGNOSIS — C8510 Unspecified B-cell lymphoma, unspecified site: Secondary | ICD-10-CM

## 2012-10-08 DIAGNOSIS — L989 Disorder of the skin and subcutaneous tissue, unspecified: Secondary | ICD-10-CM

## 2012-10-08 DIAGNOSIS — C8589 Other specified types of non-Hodgkin lymphoma, extranodal and solid organ sites: Secondary | ICD-10-CM

## 2012-10-08 DIAGNOSIS — Z8619 Personal history of other infectious and parasitic diseases: Secondary | ICD-10-CM

## 2012-10-08 MED ORDER — VALACYCLOVIR HCL 1 G PO TABS: 1000.0000 mg | ORAL_TABLET | Freq: Three times a day (TID) | ORAL | Status: DC

## 2012-10-08 NOTE — Telephone Encounter (Signed)
appts made and printed for pt aom °

## 2012-10-08 NOTE — Progress Notes (Signed)
OFFICE PROGRESS NOTE  Interval history:  Eric Sexton is a 78 year old man diagnosed with a low-grade B-cell non-Hodgkin's lymphoma in August 2005 at which time he presented with severe neck and back pain. Diagnosis was established after a laminectomy procedure was done on his neck. He was initially treated with high-dose steroids for approximately 2 months before diagnosis was established. He then received single agent Rituxan weekly x4 followed by consolidation with Rituxan at 4 month intervals for 4 additional courses through March of 2007. 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 He achieved a complete and durable remission. Serial CT and MRI scans through October 2010 showed no evidence of recurrence. Routine scans were discontinued at that time.  He presents today for scheduled followup. He denies fevers or sweats. He has a good appetite. No unintentional weight loss. He reports intentional weight loss of 4 or 6 pounds. He has not noticed any enlarged lymph nodes. No shortness of breath. He is a periodic cough at nighttime. He has intermittent back pain which is stable.  He reports being diagnosed with a "low-grade noninvasive" bladder cancer several months ago. He underwent surgery. He is followed by a urologist in [place]. He reports cystoscopies are being performed on a 3 month schedule at present.  He reports being diagnosed with a cellulitis of the nose yesterday. He was started on a course of Keflex. When he woke up this morning he had a "blister" at the tip of the nose. The nose is tender. He has had no fever.   Objective: Blood pressure 146/73, pulse 87, temperature 97.8 F (36.6 C), temperature source Oral, resp. rate 20, height 5\' 9"  (1.753 m), weight 243 lb 11.2 oz (110.542 kg).  The nose is markedly erythematous. At the tip of the nose there is an approximate 1/2 cm blisterlike lesion with a scabbed appearance. No other  lesions noted on the nose/face. Oropharynx is without thrush or ulceration. No palpable cervical, supraclavicular, axillary or inguinal lymph nodes. Lungs are clear. No wheezes or rales. Regular cardiac rhythm. Abdomen is soft and nontender. No organomegaly. Extremities are without edema. Calves are soft and nontender.  Lab Results: Lab Results  Component Value Date   WBC 7.6 10/01/2012   HGB 14.5 10/01/2012   HCT 41.4 10/01/2012   MCV 95.2 10/01/2012   PLT 188 10/01/2012    Chemistry:    Chemistry      Component Value Date/Time   NA 137 10/01/2012 1329   NA 137 04/09/2012 1047   K 4.0 10/01/2012 1329   K 4.4 04/09/2012 1047   CL 103 10/01/2012 1329   CL 98 04/09/2012 1047   CO2 24 10/01/2012 1329   CO2 29 04/09/2012 1047   BUN 18.0 10/01/2012 1329   BUN 20 04/09/2012 1047   CREATININE 0.9 10/01/2012 1329   CREATININE 0.99 04/09/2012 1047      Component Value Date/Time   CALCIUM 9.8 10/01/2012 1329   CALCIUM 9.3 04/09/2012 1047   ALKPHOS 99 10/01/2012 1329   ALKPHOS 84 04/09/2012 1047   AST 17 10/01/2012 1329   AST 18 04/09/2012 1047   ALT 24 10/01/2012 1329   ALT 19 04/09/2012 1047   BILITOT 0.30 10/01/2012 1329   BILITOT 0.3 04/09/2012 1047       Studies/Results: Dg Chest 2 View  10/01/2012  *RADIOLOGY REPORT*  Clinical Data: History of lymphoma.  CHEST - 2 VIEW  Comparison: Plain films of the chest 10/07/2004 and CT chest 04/06/2005.  Findings: Heart size is upper normal.  Lungs appear clear. Mediastinal contours are unremarkable.  No pneumothorax or pleural fluid.  Multilevel thoracic degenerative change noted.  IMPRESSION: No acute finding.  Stable compared to prior exam.   Original Report Authenticated By: Bernadene Bell. Maricela Curet, M.D.     Medications: I have reviewed the patient's current medications.  Assessment/Plan:  1. Low-grade B-cell non-Hodgkin's lymphoma diagnosed August 2005 with previous treatment as outlined above. 2. History of steroid psychosis with initial  treatment. 3. History of interstitial pneumonia, possibly pneumocystis, at time of initial diagnosis on high-dose steroids. 4. 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 5. Probable cellulitis involving the nose. He is currently completing a course of Keflex. He has a single lesion at the tip of the nose. Given the history of a previous herpes zoster infection with associated encephalitis we obtained a culture of the lesion and prescribed Valtrex 500 mg 3 times daily for 7 days. We will contact him once the culture result is available.  Disposition-Eric Sexton remains in clinical remission from the low-grade B-cell non-Hodgkin's lymphoma. He will return for a followup visit in 6 months. He will contact the office in the interim with any problems. As noted above we started him on a 7 day course of Valtrex and obtained a culture of the nose lesion. We will contact him once that result is available.  Patient seen with Dr. Cyndie Chime.  Lonna Cobb ANP/GNP-BC   CC: Dr. Vernona Rieger, [facility]; Dr. Trey Sailors; Dr. Josephine Igo.

## 2013-03-25 ENCOUNTER — Other Ambulatory Visit: Payer: BC Managed Care – PPO

## 2013-03-25 ENCOUNTER — Other Ambulatory Visit (HOSPITAL_BASED_OUTPATIENT_CLINIC_OR_DEPARTMENT_OTHER): Payer: No Typology Code available for payment source

## 2013-03-25 ENCOUNTER — Telehealth: Payer: Self-pay | Admitting: Oncology

## 2013-03-25 DIAGNOSIS — C8589 Other specified types of non-Hodgkin lymphoma, extranodal and solid organ sites: Secondary | ICD-10-CM

## 2013-03-25 DIAGNOSIS — C8510 Unspecified B-cell lymphoma, unspecified site: Secondary | ICD-10-CM

## 2013-03-25 LAB — COMPREHENSIVE METABOLIC PANEL (CC13)
ALT: 26 U/L (ref 0–55)
AST: 21 U/L (ref 5–34)
Albumin: 3.8 g/dL (ref 3.5–5.0)
Alkaline Phosphatase: 84 U/L (ref 40–150)
BUN: 21.6 mg/dL (ref 7.0–26.0)
Calcium: 9.2 mg/dL (ref 8.4–10.4)
Chloride: 106 mEq/L (ref 98–107)
Creatinine: 1.2 mg/dL (ref 0.7–1.3)
Potassium: 3.8 mEq/L (ref 3.5–5.1)

## 2013-03-25 LAB — CBC WITH DIFFERENTIAL/PLATELET
BASO%: 0.4 % (ref 0.0–2.0)
EOS%: 1.5 % (ref 0.0–7.0)
Eosinophils Absolute: 0.1 10*3/uL (ref 0.0–0.5)
LYMPH%: 27.6 % (ref 14.0–49.0)
MCH: 33.3 pg (ref 27.2–33.4)
MCHC: 35.4 g/dL (ref 32.0–36.0)
MCV: 94 fL (ref 79.3–98.0)
MONO%: 6.8 % (ref 0.0–14.0)
Platelets: 209 10*3/uL (ref 140–400)
RBC: 4.31 10*6/uL (ref 4.20–5.82)

## 2013-03-25 LAB — LACTATE DEHYDROGENASE (CC13): LDH: 193 U/L (ref 125–245)

## 2013-03-25 LAB — SEDIMENTATION RATE: Sed Rate: 5 mm/hr (ref 0–16)

## 2013-04-01 ENCOUNTER — Telehealth: Payer: Self-pay | Admitting: Oncology

## 2013-04-01 ENCOUNTER — Ambulatory Visit (HOSPITAL_BASED_OUTPATIENT_CLINIC_OR_DEPARTMENT_OTHER): Payer: No Typology Code available for payment source | Admitting: Oncology

## 2013-04-01 VITALS — BP 150/79 | HR 83 | Temp 96.9°F | Resp 20 | Ht 69.0 in | Wt 243.3 lb

## 2013-04-01 DIAGNOSIS — C8589 Other specified types of non-Hodgkin lymphoma, extranodal and solid organ sites: Secondary | ICD-10-CM

## 2013-04-01 DIAGNOSIS — C8510 Unspecified B-cell lymphoma, unspecified site: Secondary | ICD-10-CM

## 2013-04-01 NOTE — Telephone Encounter (Signed)
gv and printed appt schedule and avs for pt. for OCT....pt aware to go to radiology at [facility]

## 2013-04-01 NOTE — Progress Notes (Signed)
Hematology and Oncology Follow Up Visit  Eric Sexton [id number] 1931/11/05 79 y.o. 04/01/2013 12:27 PM   Principle Diagnosis: Encounter Diagnosis  Name Primary?  . Low grade B-cell lymphoma Yes     Interim History:    Followup visit for this 79 year old man diagnosed with Low grade B cell non-Hodgkin's lymphoma presenting with severe neck and back pain in August, 2005. Diagnosis only established after a laminectomy was done on his neck. He was initially treated with high dose steroids for almost two months before establishing a diagnosis. He then received single agent Rituxan weekly x four in November 2005. He then got consolidation with Rituxan at four month intervals for four additional courses through March of 2007. He developed steroid related psychosis and interstitial pneumonia with initial treatment. He achieved a complete and durable remission. He was  followed with serial CT scans and MRI scans which have shown no evidence for recurrence through  October of 2010. I stopped getting routine scans at that time.  He continues to do well. He has no constitutional symptoms. He gets occasional pain in his low back if he is on his feet for a long time. No new or progressive bone pain.  He reports that he was evaluated for voiding difficulty by his urologist Dr. Isaiah Serge in [facility] and was found to have low-grade noninvasive bladder cancer. He underwent transurethral resection x2. Most recent cystoscopy done a few weeks ago was clear.  Annual chest x-ray done at time of his visit here in October 2013 was normal.   Medications: reviewed  Allergies:  Allergies  Allergen Reactions  . Prednisone Other (See Comments)    Steroid psychosis  . Sulfur Other (See Comments)    confusion    Review of Systems: Constitutional:   See above Respiratory: No cough or dyspnea Cardiovascular:  No chest pain or palpitations Gastrointestinal: No abdominal pain or change in bowel  habit Genito-Urinary: No urinary tract symptoms. See above Musculoskeletal: See above Neurologic: No headache or change in vision Skin: No rash Remaining ROS negative.  Physical Exam: Blood pressure 150/79, pulse 83, temperature 96.9 F (36.1 C), temperature source Oral, resp. rate 20, height 5\' 9"  (1.753 m), weight 243 lb 4.8 oz (110.36 kg). Wt Readings from Last 3 Encounters:  04/01/13 243 lb 4.8 oz (110.36 kg)  10/08/12 243 lb 11.2 oz (110.542 kg)  04/09/12 251 lb 9.6 oz (114.125 kg)     General appearance: Well-nourished Caucasian man HENNT: Pharynx no erythema or exudate Lymph nodes: No cervical, supraclavicular, axillary, or inguinal adenopathy Breasts: Lungs: Clear to auscultation resonant to percussion Heart: Regular rhythm no murmur Abdomen: Soft, nontender, no mass, no organomegaly Extremities: No edema no calf tenderness Vascular: No cyanosis Neurologic: Motor strength 5 over 5, reflexes 1+ symmetric Skin: No rash or ecchymosis  Lab Results: Lab Results  Component Value Date   WBC 7.3 03/25/2013   HGB 14.3 03/25/2013   HCT 40.5 03/25/2013   MCV 94.0 03/25/2013   PLT 209 03/25/2013     Chemistry      Component Value Date/Time   NA 139 03/25/2013 1347   NA 137 04/09/2012 1047   K 3.8 03/25/2013 1347   K 4.4 04/09/2012 1047   CL 106 03/25/2013 1347   CL 98 04/09/2012 1047   CO2 21* 03/25/2013 1347   CO2 29 04/09/2012 1047   BUN 21.6 03/25/2013 1347   BUN 20 04/09/2012 1047   CREATININE 1.2 03/25/2013 1347   CREATININE 0.99 04/09/2012  1047      Component Value Date/Time   CALCIUM 9.2 03/25/2013 1347   CALCIUM 9.3 04/09/2012 1047   ALKPHOS 84 03/25/2013 1347   ALKPHOS 84 04/09/2012 1047   AST 21 03/25/2013 1347   AST 18 04/09/2012 1047   ALT 26 03/25/2013 1347   ALT 19 04/09/2012 1047   BILITOT 0.38 03/25/2013 1347   BILITOT 0.3 04/09/2012 1047       Radiological Studies: No results found.  Impression and Plan: #1. Low grade B-cell non-Hodgkin's lymphoma treated as  outlined above He remains free of any obvious progression now out a remarkable 9 years from diagnosis. We are seeing him twice a year at this time. CT scans directed by changes in exam or symptoms only.  #2. History of steroid psychosis with initial treatment  #3. History of interstitial pneumonia possibly pneumocystis at time of initial diagnosis when on high-dose steroids  #4. 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    CC:.    Levert Feinstein, MD 4/22/201412:27 PM

## 2013-09-23 ENCOUNTER — Other Ambulatory Visit (HOSPITAL_BASED_OUTPATIENT_CLINIC_OR_DEPARTMENT_OTHER): Payer: No Typology Code available for payment source | Admitting: Lab

## 2013-09-23 ENCOUNTER — Ambulatory Visit (HOSPITAL_COMMUNITY)
Admission: RE | Admit: 2013-09-23 | Discharge: 2013-09-23 | Disposition: A | Discharge status: Home / Self Care | Payer: No Typology Code available for payment source | Source: Ambulatory Visit | Attending: Oncology | Admitting: Oncology

## 2013-09-23 DIAGNOSIS — I517 Cardiomegaly: Secondary | ICD-10-CM | POA: Insufficient documentation

## 2013-09-23 DIAGNOSIS — C8589 Other specified types of non-Hodgkin lymphoma, extranodal and solid organ sites: Secondary | ICD-10-CM

## 2013-09-23 DIAGNOSIS — M47814 Spondylosis without myelopathy or radiculopathy, thoracic region: Secondary | ICD-10-CM | POA: Insufficient documentation

## 2013-09-23 DIAGNOSIS — C8510 Unspecified B-cell lymphoma, unspecified site: Secondary | ICD-10-CM

## 2013-09-23 LAB — CBC WITH DIFFERENTIAL/PLATELET
BASO%: 0.9 % (ref 0.0–2.0)
Basophils Absolute: 0.1 10*3/uL (ref 0.0–0.1)
EOS%: 0.6 % (ref 0.0–7.0)
HCT: 44.5 % (ref 38.4–49.9)
HGB: 15.4 g/dL (ref 13.0–17.1)
LYMPH%: 28.6 % (ref 14.0–49.0)
MCH: 32.6 pg (ref 27.2–33.4)
MCHC: 34.6 g/dL (ref 32.0–36.0)
MCV: 94 fL (ref 79.3–98.0)
MONO%: 9 % (ref 0.0–14.0)
NEUT%: 60.9 % (ref 39.0–75.0)
lymph#: 2.2 10*3/uL (ref 0.9–3.3)

## 2013-09-23 LAB — COMPREHENSIVE METABOLIC PANEL (CC13)
ALT: 28 U/L (ref 0–55)
Albumin: 3.7 g/dL (ref 3.5–5.0)
Anion Gap: 10 mEq/L (ref 3–11)
BUN: 21 mg/dL (ref 7.0–26.0)
Calcium: 9.6 mg/dL (ref 8.4–10.4)
Chloride: 99 mEq/L (ref 98–109)
Glucose: 178 mg/dl — ABNORMAL HIGH (ref 70–140)
Potassium: 4.7 mEq/L (ref 3.5–5.1)

## 2013-09-30 ENCOUNTER — Encounter (INDEPENDENT_AMBULATORY_CARE_PROVIDER_SITE_OTHER): Payer: Self-pay

## 2013-09-30 ENCOUNTER — Ambulatory Visit (HOSPITAL_BASED_OUTPATIENT_CLINIC_OR_DEPARTMENT_OTHER): Payer: No Typology Code available for payment source | Admitting: Nurse Practitioner

## 2013-09-30 VITALS — BP 158/70 | HR 74 | Temp 97.4°F | Resp 20 | Ht 69.0 in | Wt 243.5 lb

## 2013-09-30 DIAGNOSIS — C8589 Other specified types of non-Hodgkin lymphoma, extranodal and solid organ sites: Secondary | ICD-10-CM

## 2013-09-30 DIAGNOSIS — C8510 Unspecified B-cell lymphoma, unspecified site: Secondary | ICD-10-CM

## 2013-09-30 NOTE — Progress Notes (Signed)
OFFICE PROGRESS NOTE  Interval history:   Eric Sexton is a 79 year old man diagnosed with a low-grade B-cell non-Hodgkin's lymphoma in August 2005 at which time he presented with severe neck and back pain. Diagnosis was established after a laminectomy procedure was done on his neck. He was initially treated with high-dose steroids for approximately 2 months before diagnosis was established. He then received single agent Rituxan weekly x4 followed by consolidation with Rituxan at 4 month intervals for 4 additional courses through March of 2007. 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 He achieved a complete and durable remission. Serial CT and MRI scans through October 2010 showed no evidence of recurrence. Routine scans were discontinued at that time.  He is seen today for scheduled followup. He feels well. No interim illnesses or infections. He has a good appetite. He denies weight loss. No fevers or sweats. He has stable arthritis pain. No new areas of pain.  No change in bowel habits. No nausea or vomiting. No skin rash. He denies hematuria or dysuria. He reports a negative cystoscopy yesterday and 3 months prior.   Objective: Blood pressure 158/70, pulse 74, temperature 97.4 F (36.3 C), temperature source Oral, resp. rate 20, height 5\' 9"  (1.753 m), weight 243 lb 8 oz (110.451 kg).  Oropharynx is without thrush or ulceration. No palpable cervical, supraclavicular, axillary or inguinal lymph nodes. Lungs are clear. No wheezes or rales. Regular cardiac rhythm. No murmur. Abdomen is soft and nontender. No organomegaly. Extremities are without edema. Calves soft and nontender. No skin rash. He is alert and oriented. Gait normal.  Lab Results: Lab Results  Component Value Date   WBC 7.5 09/23/2013   HGB 15.4 09/23/2013   HCT 44.5 09/23/2013   MCV 94.0 09/23/2013   PLT 194 09/23/2013    Chemistry:    Chemistry      Component Value  Date/Time   NA 138 09/23/2013 1151   NA 137 04/09/2012 1047   K 4.7 09/23/2013 1151   K 4.4 04/09/2012 1047   CL 106 03/25/2013 1347   CL 98 04/09/2012 1047   CO2 29 09/23/2013 1151   CO2 29 04/09/2012 1047   BUN 21.0 09/23/2013 1151   BUN 20 04/09/2012 1047   CREATININE 1.2 09/23/2013 1151   CREATININE 0.99 04/09/2012 1047      Component Value Date/Time   CALCIUM 9.6 09/23/2013 1151   CALCIUM 9.3 04/09/2012 1047   ALKPHOS 77 09/23/2013 1151   ALKPHOS 84 04/09/2012 1047   AST 19 09/23/2013 1151   AST 18 04/09/2012 1047   ALT 28 09/23/2013 1151   ALT 19 04/09/2012 1047   BILITOT 0.40 09/23/2013 1151   BILITOT 0.3 04/09/2012 1047       Studies/Results: Dg Chest 2 View  09/23/2013   CLINICAL DATA:  Low-grade B-cell lymphoma.  EXAM: CHEST  2 VIEW  COMPARISON:  10/01/2012.  FINDINGS: Mild cardiomegaly noted with cephalization of blood flow. No overt edema.  Cervical plate and screw fixator noted. Thoracic spondylosis is present.  No pleural effusion.  IMPRESSION: 1. Mild cardiomegaly with cephalad is blood flow suggesting pulmonary venous hypertension. No overt edema.   Electronically Signed   By: Herbie Baltimore M.D.   On: 09/23/2013 12:57    Medications: I have reviewed the patient's current medications.  Assessment/Plan:  1. Low-grade B-cell non-Hodgkin's lymphoma diagnosed August 2005 with previous treatment as outlined above. 2. History of steroid psychosis with initial treatment. 3. History of interstitial  pneumonia, possibly pneumocystis, at time of initial diagnosis on high-dose steroids. 4. 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  Disposition-Mr. Garden appears stable. He remains in clinical remission from non-Hodgkin's lymphoma. He will return for labs and a followup visit in 6 months. He will contact the office in the interim with any problems.  Lonna Cobb ANP/GNP-BC

## 2013-10-01 ENCOUNTER — Telehealth: Payer: Self-pay | Admitting: Oncology

## 2013-10-01 NOTE — Telephone Encounter (Signed)
lvmm appts on [id number] mailed calendar to pt shh

## 2014-02-07 ENCOUNTER — Encounter: Payer: Self-pay | Admitting: Oncology

## 2014-03-05 ENCOUNTER — Telehealth: Payer: Self-pay | Admitting: Hematology and Oncology

## 2014-03-05 NOTE — Telephone Encounter (Signed)
cld pt to adv of new sch chge w/Dr Ileana Roup 4/23 @9 -pt understood

## 2014-03-05 NOTE — Telephone Encounter (Signed)
add to prev note appt chge per NG

## 2014-04-01 ENCOUNTER — Other Ambulatory Visit: Payer: No Typology Code available for payment source | Admitting: Lab

## 2014-04-01 ENCOUNTER — Telehealth: Payer: Self-pay | Admitting: Hematology and Oncology

## 2014-04-01 ENCOUNTER — Ambulatory Visit: Payer: No Typology Code available for payment source | Admitting: Oncology

## 2014-04-01 NOTE — Telephone Encounter (Signed)
returned pt call adn r/s appt per pt request....pt aware of new d.t

## 2014-04-02 ENCOUNTER — Ambulatory Visit: Payer: No Typology Code available for payment source | Admitting: Hematology and Oncology

## 2014-04-02 ENCOUNTER — Other Ambulatory Visit: Payer: No Typology Code available for payment source

## 2014-04-03 ENCOUNTER — Ambulatory Visit: Payer: No Typology Code available for payment source | Admitting: Oncology

## 2014-04-03 ENCOUNTER — Other Ambulatory Visit: Payer: No Typology Code available for payment source

## 2014-04-24 ENCOUNTER — Encounter: Payer: Self-pay | Admitting: Hematology and Oncology

## 2014-04-24 ENCOUNTER — Other Ambulatory Visit (HOSPITAL_BASED_OUTPATIENT_CLINIC_OR_DEPARTMENT_OTHER): Payer: Medicare HMO

## 2014-04-24 ENCOUNTER — Ambulatory Visit (HOSPITAL_BASED_OUTPATIENT_CLINIC_OR_DEPARTMENT_OTHER): Payer: Medicare HMO | Admitting: Hematology and Oncology

## 2014-04-24 VITALS — BP 160/67 | HR 76 | Temp 97.4°F | Resp 18 | Ht 69.0 in | Wt 242.3 lb

## 2014-04-24 DIAGNOSIS — Z8551 Personal history of malignant neoplasm of bladder: Secondary | ICD-10-CM

## 2014-04-24 DIAGNOSIS — C8510 Unspecified B-cell lymphoma, unspecified site: Secondary | ICD-10-CM

## 2014-04-24 DIAGNOSIS — Z87898 Personal history of other specified conditions: Secondary | ICD-10-CM

## 2014-04-24 LAB — CBC WITH DIFFERENTIAL/PLATELET
BASO%: 0.4 % (ref 0.0–2.0)
Basophils Absolute: 0 10*3/uL (ref 0.0–0.1)
EOS%: 0.9 % (ref 0.0–7.0)
Eosinophils Absolute: 0.1 10*3/uL (ref 0.0–0.5)
HCT: 38.9 % (ref 38.4–49.9)
HEMOGLOBIN: 13.4 g/dL (ref 13.0–17.1)
LYMPH#: 1.8 10*3/uL (ref 0.9–3.3)
LYMPH%: 33 % (ref 14.0–49.0)
MCH: 32.8 pg (ref 27.2–33.4)
MCHC: 34.4 g/dL (ref 32.0–36.0)
MCV: 95.3 fL (ref 79.3–98.0)
MONO#: 0.4 10*3/uL (ref 0.1–0.9)
MONO%: 7.7 % (ref 0.0–14.0)
NEUT#: 3.1 10*3/uL (ref 1.5–6.5)
NEUT%: 58 % (ref 39.0–75.0)
PLATELETS: 186 10*3/uL (ref 140–400)
RBC: 4.08 10*6/uL — ABNORMAL LOW (ref 4.20–5.82)
RDW: 13.5 % (ref 11.0–14.6)
WBC: 5.4 10*3/uL (ref 4.0–10.3)

## 2014-04-24 LAB — COMPREHENSIVE METABOLIC PANEL (CC13)
ALT: 24 U/L (ref 0–55)
ANION GAP: 12 meq/L — AB (ref 3–11)
AST: 18 U/L (ref 5–34)
Albumin: 3.8 g/dL (ref 3.5–5.0)
Alkaline Phosphatase: 76 U/L (ref 40–150)
BILIRUBIN TOTAL: 0.43 mg/dL (ref 0.20–1.20)
BUN: 18.1 mg/dL (ref 7.0–26.0)
CO2: 23 mEq/L (ref 22–29)
CREATININE: 1.1 mg/dL (ref 0.7–1.3)
Calcium: 9.4 mg/dL (ref 8.4–10.4)
Chloride: 105 mEq/L (ref 98–109)
Glucose: 152 mg/dl — ABNORMAL HIGH (ref 70–140)
Potassium: 4.3 mEq/L (ref 3.5–5.1)
Sodium: 140 mEq/L (ref 136–145)
Total Protein: 7 g/dL (ref 6.4–8.3)

## 2014-04-24 LAB — LACTATE DEHYDROGENASE (CC13): LDH: 175 U/L (ref 125–245)

## 2014-04-24 NOTE — Progress Notes (Signed)
**Note Eric-Identified via Obfuscation** Green progress notes  Patient Care Team: Alma Friendly, MD as PCP - General (Internal Medicine)  CHIEF COMPLAINTS/PURPOSE OF VISIT:  Remote history of lymphoma, no evidence of disease  HISTORY OF PRESENTING ILLNESS:  Eric Sexton 80 y.o. male was transferred to my care after his prior physician has left.  I reviewed the patient's records extensive and collaborated the history with the patient. Summary of his history is as follows: This patient was diagnosed with low grade B cell non-Hodgkin's lymphoma presenting with severe neck and back pain in August, 2005. Diagnosis only established after a laminectomy was done on his neck. He was initially treated with high dose steroids for almost two months before establishing a diagnosis. He then received single agent Rituxan weekly x four in November 2005. He then got consolidation with Rituxan at four month intervals for four additional courses through March of 2007. He developed steroid related psychosis and interstitial pneumonia with initial treatment. He achieved a complete and durable remission. He was  followed with serial CT scans and MRI scans which have shown no evidence for recurrence through  October of 2010. He reports that he was evaluated for voiding difficulty by his urologist Dr. Hamilton Capri in [facility] and was found to have low-grade noninvasive bladder cancer. He underwent transurethral resection x2.   He denies new lymphadenopathy. He denies any recent fever, chills, night sweats or abnormal weight loss   MEDICAL HISTORY:  Past Medical History  Diagnosis Date  . Low grade B-cell lymphoma 04/09/2012    Dx 8/05 hypercalcemia/isolated bone disease; Rx steroids then Rituxan Q wk x4 then Q 4 mos x 4 thru 3/07  . 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 Herpes zoster 04/09/2012    Nov 2009  w assoc encephalitis & interstital pneumonia  . Bladder cancer   . Cancer     non-hodgkin lymphoma  .  Hypertension     SURGICAL HISTORY: Past Surgical History  Procedure Laterality Date  . Cystoscopy      SOCIAL HISTORY: History   Social History  . Marital Status: Married    Spouse Name: N/A    Number of Children: N/A  . Years of Education: N/A   Occupational History  . Not on file.   Social History Main Topics  . Smoking status: Never Smoker   . Smokeless tobacco: Never Used  . Alcohol Use: No  . Drug Use: No  . Sexual Activity: Not on file   Other Topics Concern  . Not on file   Social History Narrative  . No narrative on file    FAMILY HISTORY: Family History  Problem Relation Age of Onset  . Cancer Brother     prostate ca  . Cancer Brother     lung ca    ALLERGIES:  is allergic to pravachol; prednisone; and sulfur.  MEDICATIONS:  Current Outpatient Prescriptions  Medication Sig Dispense Refill  . aspirin 81 MG tablet Take 81 mg by mouth. 2 to 3 times per month      . fish oil-omega-3 fatty acids 1000 MG capsule Take 500 mg by mouth daily.      Marland Kitchen lisinopril (PRINIVIL,ZESTRIL) 40 MG tablet Take 40 mg by mouth daily.       . Multiple Vitamin (MULTIVITAMIN) tablet Take 1 tablet by mouth daily.      . nabumetone (RELAFEN) 750 MG tablet Take 750 mg by mouth daily.      . pantoprazole (PROTONIX) 40 MG tablet  Take 40 mg by mouth daily.       . saw palmetto 160 MG capsule Take 160 mg by mouth daily.       No current facility-administered medications for this visit.    REVIEW OF SYSTEMS:   Constitutional: Denies fevers, chills or abnormal night sweats Eyes: Denies blurriness of vision, double vision or watery eyes Ears, nose, mouth, throat, and face: Denies mucositis or sore throat Respiratory: Denies cough, dyspnea or wheezes Cardiovascular: Denies palpitation, chest discomfort or lower extremity swelling Gastrointestinal:  Denies nausea, heartburn or change in bowel habits Skin: Denies abnormal skin rashes Lymphatics: Denies new lymphadenopathy or easy  bruising Neurological:Denies numbness, tingling or new weaknesses Behavioral/Psych: Mood is stable, no new changes  All other systems were reviewed with the patient and are negative.  PHYSICAL EXAMINATION: ECOG PERFORMANCE STATUS: 0 - Asymptomatic  Filed Vitals:   04/24/14 1206  BP: 160/67  Pulse: 76  Temp: 97.4 F (36.3 C)  Resp: 18   Filed Weights   04/24/14 1206  Weight: 242 lb 4.8 oz (109.907 kg)    GENERAL:alert, no distress and comfortable. He is obese SKIN: skin color, texture, turgor are normal, no rashes or significant lesions EYES: normal, conjunctiva are pink and non-injected, sclera clear OROPHARYNX:no exudate, normal lips, buccal mucosa, and tongue  NECK: supple, thyroid normal size, non-tender, without nodularity LYMPH:  no palpable lymphadenopathy in the cervical, axillary or inguinal LUNGS: clear to auscultation and percussion with normal breathing effort HEART: regular rate & rhythm and no murmurs without lower extremity edema ABDOMEN:abdomen soft, non-tender and normal bowel sounds Musculoskeletal:no cyanosis of digits and no clubbing  PSYCH: alert & oriented x 3 with fluent speech NEURO: no focal motor/sensory deficits  LABORATORY DATA:  I have reviewed the data as listed Lab Results  Component Value Date   WBC 5.4 04/24/2014   HGB 13.4 04/24/2014   HCT 38.9 04/24/2014   MCV 95.3 04/24/2014   PLT 186 04/24/2014    Recent Labs  09/23/13 1151 04/24/14 1150  NA 138 140  K 4.7 4.3  CO2 29 23  GLUCOSE 178* 152*  BUN 21.0 18.1  CREATININE 1.2 1.1  CALCIUM 9.6 9.4  PROT 7.7 7.0  ALBUMIN 3.7 3.8  AST 19 18  ALT 28 24  ALKPHOS 77 76  BILITOT 0.40 0.43   ASSESSMENT & PLAN:  #1 history of non-Hodgkin lymphoma, no recurrence The patient is more than 5 years out from his original treatment. I recommend discharging him from the hematology clinic and followup with primary care provider only #2 history of bladder cancer I recommend he follows with  urology appointment.  All questions were answered. The patient knows to call the clinic with any problems, questions or concerns. I spent 15 minutes counseling the patient face to face. The total time spent in the appointment was 20 minutes and more than 50% was on counseling.     Heath Lark, MD 04/24/2014 3:15 PM

## 2020-12-11 DEATH — deceased
